# Patient Record
Sex: Male | Born: 1955 | Race: White | Hispanic: No | Marital: Married
Health system: Midwestern US, Academic
[De-identification: ages and names within clinical notes are randomized; demographics above are authoritative.]

---

## 2014-01-12 ENCOUNTER — Ambulatory Visit: Admit: 2014-01-12 | Discharge: 2014-01-12 | Payer: PRIVATE HEALTH INSURANCE

## 2014-01-12 DIAGNOSIS — M545 Low back pain, unspecified: Secondary | ICD-10-CM

## 2014-01-12 NOTE — Unmapped (Signed)
This office note has been dictated.

## 2014-01-16 NOTE — Unmapped (Signed)
[facility]     PATIENT NAME: Jacob West, Jacob West                     MRN: [id number]  DATE OF BIRTH: 05/22/1955                      CSN: [id number]  PROVIDER: Cephus Shelling, M.D.                 VISIT DATE: 01/12/2014                                NEW PATIENT OFFICE VISIT     HISTORY OF PRESENT ILLNESS:  Jacob West is a pleasant, 59 year old gentleman  in our clinic today for evaluation of severe back and right leg pain.  He  states that these symptoms have been present since August of 1973.  He has  had multiple treatments including epidural injections, physical therapy and  chiropractic care.  Symptoms are aggravated by work, standing and walking for  prolonged periods of time.  He also has some pain that starts in the right  groin and radiates down the front of the leg, but not past the knee.  He also  has sciatic symptoms down the back of the right leg.  The groin pain is very  bothersome, but has only been present for the last few months.  His back pain  has been a more chronic issue along with the episodic right leg pain.     ALLERGIES:  1. Adhesive tape allergies.     PAST MEDICAL AND SURGICAL HISTORY:  Reviewed, are pertinent for no chronic  medical conditions.  He does have a history of lithotripsy and excision of  skin cancers.     SOCIAL HISTORY:  The patient denies tobacco, alcohol or illicit drug use.     REVIEW OF SYSTEMS:  Ten-point review of systems negative for bowel or bladder  dysfunction, cardiac, pulmonary, renal or neurologic conditions.     PHYSICAL EXAMINATION:  Jacob West is an obese, 59 year old gentleman.  He is  no acute distress.  He has a good respiratory effort.  He has pain when  standing erect for more than a few seconds.  He has good range of motion of  the lumbar spine without significant restrictions.  Strength in the lower  extremities is maintained without focal deficits.  Neurologic exam  shows  symmetric patellar reflexes with no clonus on range of motion bilaterally.  Range of motion of the lower extremities elicits pain with extremes of  flexion of the right hip.  He has an overall normal gait.     IMAGING STUDIES:  MRI of the lumbar spine shows some moderate stenosis at the  lower lumbar levels, specifically at L5-S1 where there is degenerative  spondylolisthesis.  Upright x-rays of the lumbar spine show a degenerative  spondylolisthesis without acute fracture or malalignment.     ASSESSMENT AND PLAN:  Jacob West is a 59 year old gentleman who has had a  chronic issue with his back.  He has a spondylolisthesis at L5-S1.  He  suffers from a lot of back pain.  He has intermittent right leg sciatica and  a new onset groin pain.  We discussed with the patient that  surgery may not  relieve his back symptoms.  It would be more for any pressure on the nerves  and is moderate at best on the MRI.  Would recommend getting an MRI of the  right hip to evaluate for any intra-articular hip pathology.  Work on weight  loss to decrease the burden on the spine to hopefully relieve some of this  pain.  Follow-up for results of the right hip MRI.     Dr. Sandi Mealy was present and participated in all key portions of exam.     Dictated by Madalyn Rob, P.A.                                                 Cephus Shelling, M.D.  SA/nwc  D:  01/16/2014 17:48  T:  01/17/2014 16:32  R:  01/18/2014 07:25 - akg  Job #:  [id number]           NEW PATIENT OFFICE VISIT                                     PAGE    1 of   1

## 2014-01-16 NOTE — Unmapped (Signed)
Pt is scheduled for mri hip right wo . Pt is scheduled for 01/18/14 3:15. [facility] . Gave pt details.

## 2014-01-27 ENCOUNTER — Ambulatory Visit: Admit: 2014-01-27 | Discharge: 2014-01-27 | Payer: PRIVATE HEALTH INSURANCE

## 2014-01-27 DIAGNOSIS — M4317 Spondylolisthesis, lumbosacral region: Secondary | ICD-10-CM

## 2014-01-27 NOTE — Unmapped (Signed)
This office note has been dictated.

## 2014-01-27 NOTE — Unmapped (Signed)
[facility] AND [facility] MEDIC[facility]     PATIENT NAME:  Jacob West, Jacob West                      MRN:  [id number]  DATE OF BIRTH:  03/07/1955                       CSN:  [id number]  PROVIDER:  Cephus Shelling, M.D.                  VISIT DATE:  01/27/2014                                       OFFICE NOTE     Mr. Bierlein is here today for results of his MRI of his hip.  He has stenosis  in his lower back given his spondylolisthesis at L5-S1.  The findings are  moderate, but he has severe pain.  He has had epidural injections in the  past, which do give him benefit.  He also has some groin pain that started  more recently that is fairly severe.  It does not seem to increase with  walking.  The patient also complains of severe back and right radicular leg  pain.     MRI of the right hip was reviewed.  It shows no severe arthritis of the hip.  He does have pistol grip deformities bilaterally but no ____ paralabral cyst  formation or high-grade chondromalacia.     ASSESSMENT AND PLAN:  Mr. Landess had an MRI of his hip to rule out severe  arthritis given his groin pain.  The MRI is inconclusive for any severe  arthritis.  He does have a previous MRI of his lower back that shows moderate  stenosis.  He has a lot of back pain, and he also has right leg pain. It was  discussed with him and his wife today that surgery would most likely not  benefit his back pain.  It was recommended that he work on weight loss and  continue physical therapy exercises before deciding to proceed with any  surgical intervention in his back.  He is seeking a second opinion and will  follow up as needed.     I discussed the history and physical examination findings with Dr. Sandi Mealy,  who is in agreement with the treatment plan.     Dictated by Hillery Hunter, P.A.                                              Cephus Shelling, M.D.  SA/jlq  D:  01/27/2014 17:21  T:  01/31/2014 11:34  R:   01/31/2014 13:32 - kp  Job #:  [id number]           OFFICE NOTE                                                  PAGE    1 of   1

## 2017-11-04 IMAGING — CT CT ABDOMEN PELVIS W/ UROGRAM
2 of 5 series · 14 of 42 positions shown, 16 images · IV contrast (APPLIED)
Comparison: Comparison was made to the prior exam(s) within the last 12 months 
dated  ultrasound kidneys 10/21/2017.

CT ABDOMEN PELVIS W/ UROGRAM, 11/04/2017 [DATE]: 
CLINICAL INDICATION:  Elevated white blood cells history of leukemia and 
hydronephrosis. 
A search for DICOM formatted images was conducted for prior CT imaging studies 
completed at a non-affiliated media free facility.
TECHNIQUE: The region of interest was scanned without and with 100 IV contrast 
on a high resolution CT scanner using dose reduction techniques.  Routine MPR 
reconstructions were performed.

[Series 5: axial portal · axial · portal-venous · 0.98mm/px · z∈[-443,+46]mm · 11 of 197 slices shown, 13 images]
[im 17/197  soft-tissue]
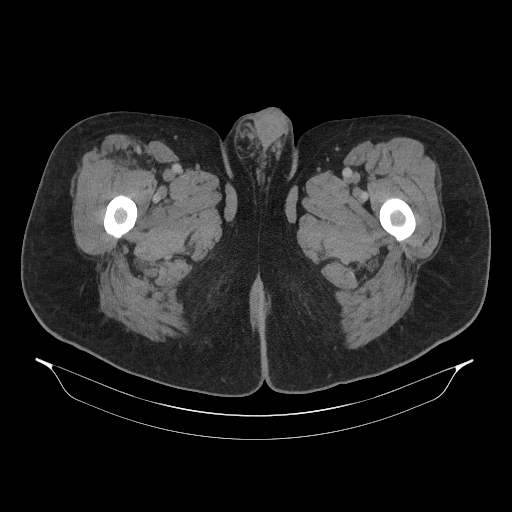
[im 17/197  bone]
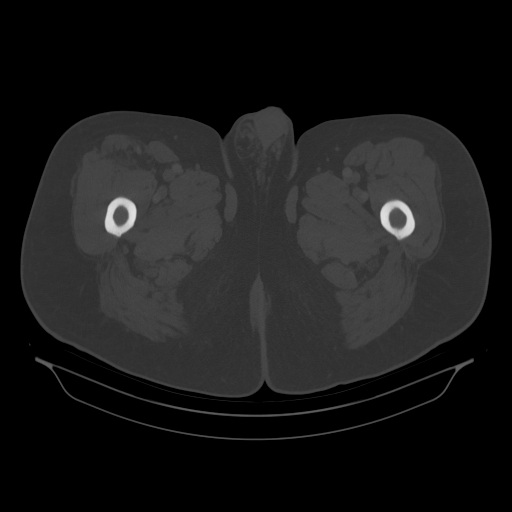
[im 33/197  soft-tissue]
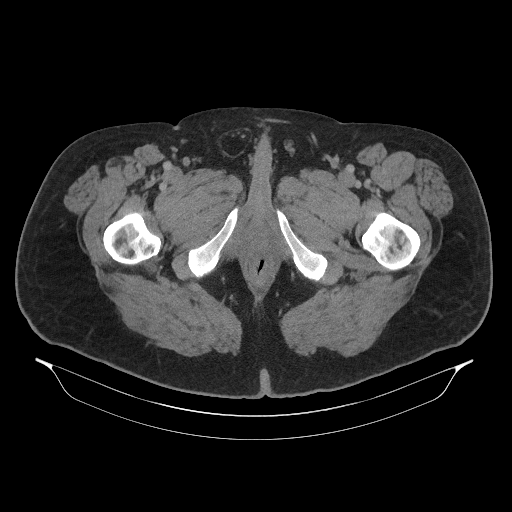
[im 50/197  soft-tissue]
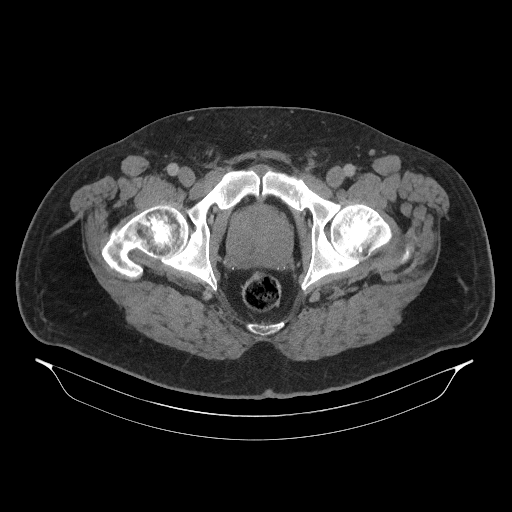
[im 66/197  soft-tissue]
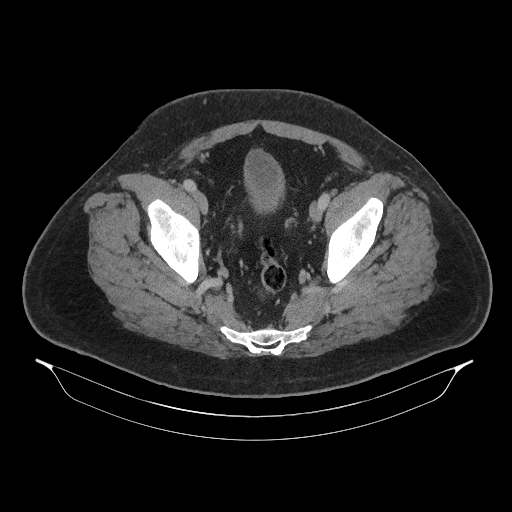
[im 82/197  soft-tissue]
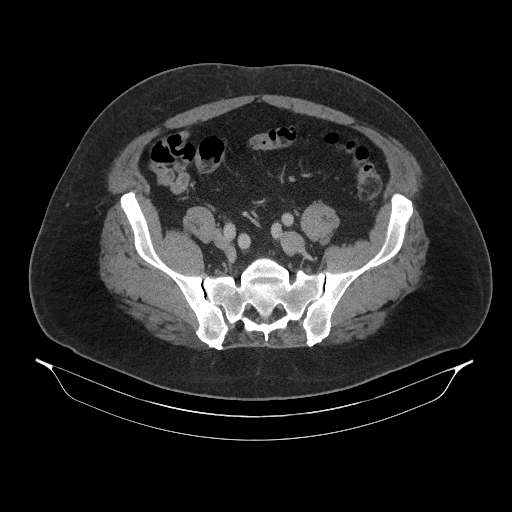
[im 99/197  soft-tissue]
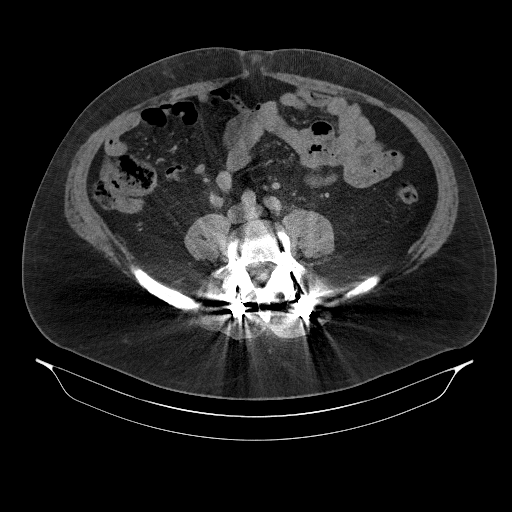
[im 115/197  soft-tissue]
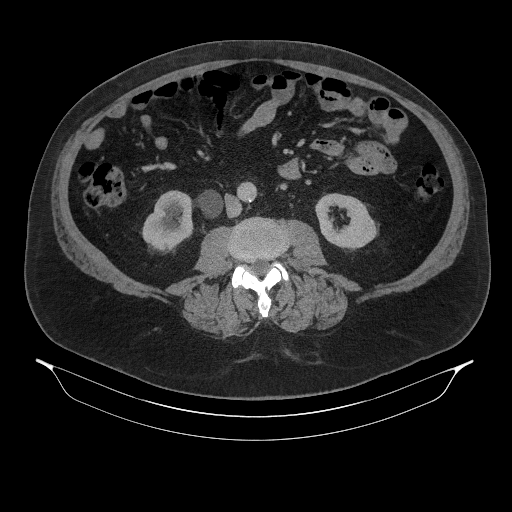
[im 131/197  soft-tissue]
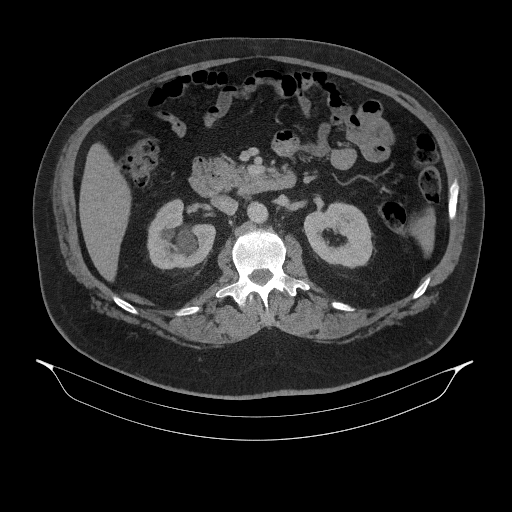
[im 148/197  soft-tissue]
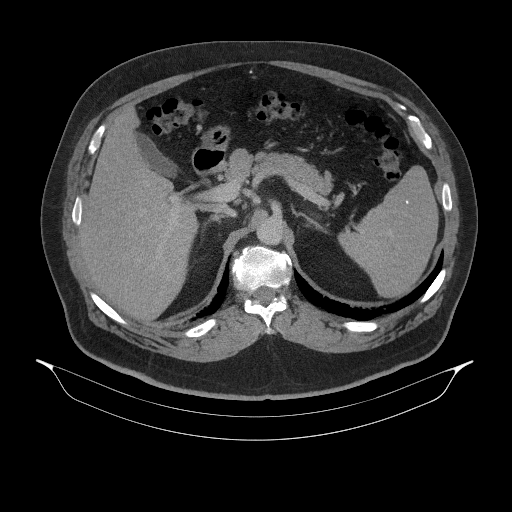
[im 148/197  bone]
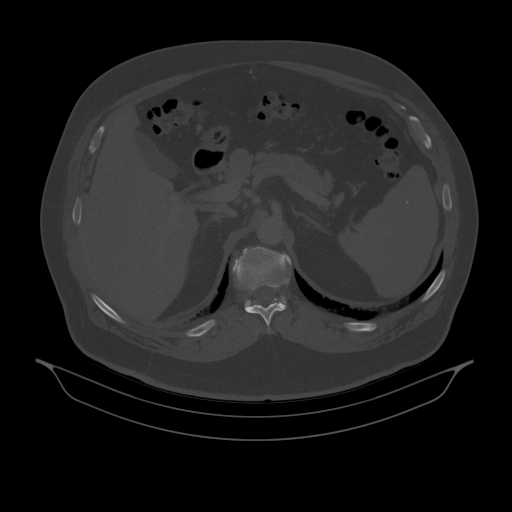
[im 164/197  soft-tissue]
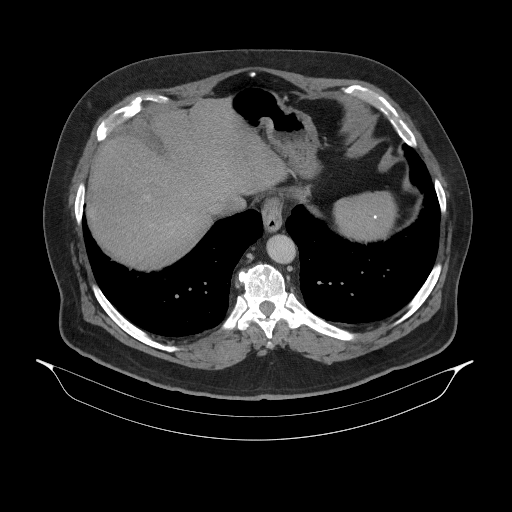
[im 180/197  soft-tissue]
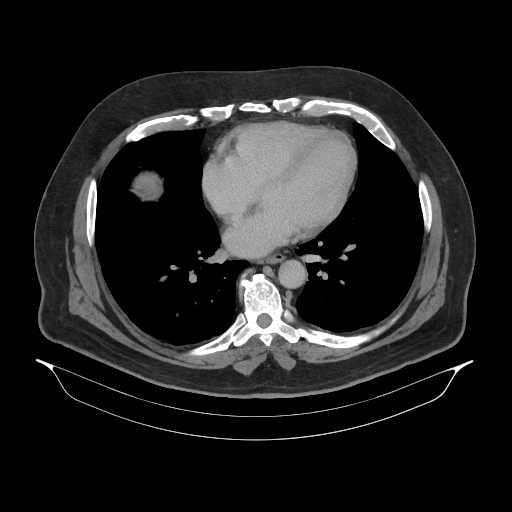

[Series 6: cor portal · coronal · portal-venous · 0.98mm/px · 3 of 156 slices shown]
[im 39/156  soft-tissue]
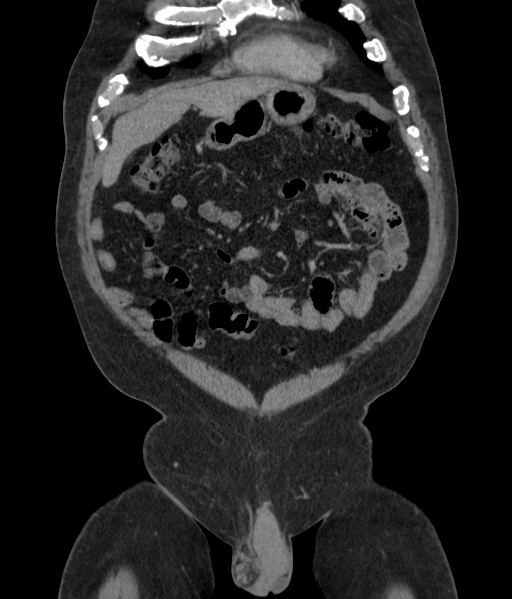
[im 78/156  soft-tissue]
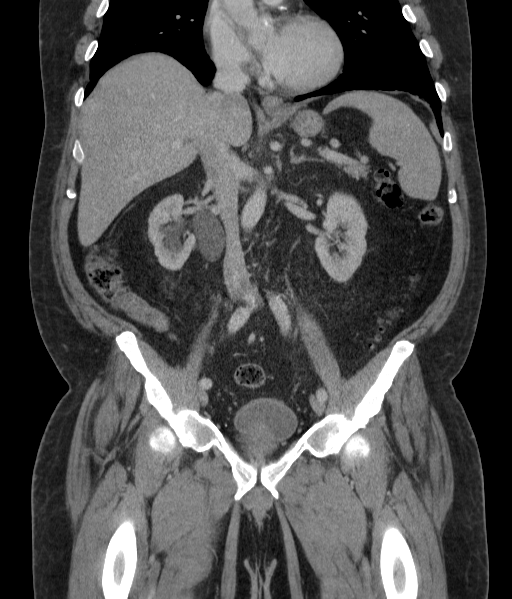
[im 117/156  soft-tissue]
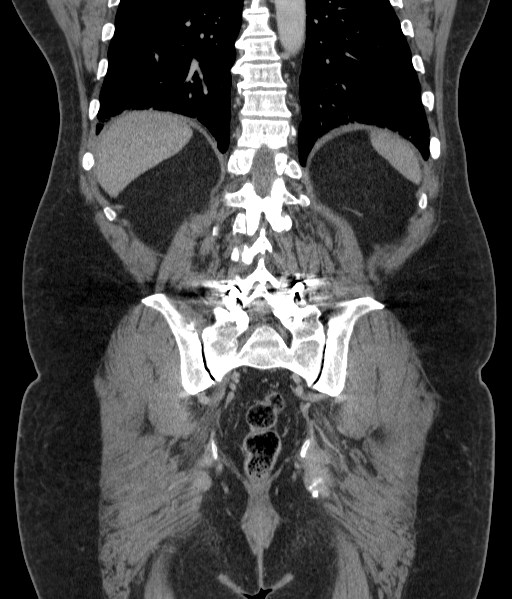

[14 of 42 positions shown; findings below may reference images not displayed]

FINDINGS: KIDNEYS: Lobulated cortex right kidney moderate severe right hydronephrosis 
secondary to 13 mm calculus within the proximal left ureter at the level of the 
aortic bifurcation/L4-5 disc space. Left kidney is without nephrolithiasis no 
solid renal mass. Delayed hilar gram on the right limited opacification of the 
right ureter to the level of the obstruction. 
URETERS: Limited opacification of the right ureter secondary to the obstruction 
from the calculus noted above. The left collecting system and ureter are normal 
caliber with no abnormal contrast enhancement. 
BLADDER: Mild bladder wall thickening the prominent prostate measuring 6.5 cm. 
LUNG BASES: Clear. 
LIVER : Fatty infiltration. No mass.  No intrahepatic biliary dilatation. 
GALLBLADDER: No wall thickening.  No stone. 
COMMON BILE DUCT: Normal caliber.  No stones. 
SPLEEN: Calcified granulomas. 
PANCREAS: No mass.  No pancreatic fluid collections. 
ADRENALS: No masses. 
LYMPH NODES: No adenopathy. 
STOMACH, SMALL BOWEL AND COLON: No bowel wall thickening or obstruction. 
Duodenal diverticulum. 
PERITONEAL CAVITY: No mesenteric stranding or free fluid. Mild misty mesentery. 
Right inguinal hernia containing fat. Small umbilical hernia containing fat. 
OSSEOUS STRUCTURES: Postsurgical change lumbar spine pedicular screws L5 and S1 
with associated posterior fixation rods and interbody fusion of L5-S1 less than 
grade 1 anterolisthesis of L5. Mild loss of height of the superior endplate of 
L2. No acute fracture or destructive lesion. 
ABDOMINAL AORTA: No aneurysm. 
PELVIC ORGANS: Mild bladder wall thickening. Prominent prostate measuring
cm.
IMPRESSION: Moderate severe right hydronephrosis and hydroureter down to level of the aortic 
bifurcation/L4-5 disc space. 
Renal cysts but no solid renal mass. 
No left nephrolithiasis or hydronephrosis. 
Prominent prostate with mild bladder wall thickening. 
No abnormal contrast enhancement collecting system or bladder. 
Fatty liver. 
Old granulomatous disease. 
Inguinal and umbilical hernias containing fat. 
RADIATION DOSE REDUCTION: All CT scans are performed using radiation dose 
reduction techniques, when applicable.  Technical factors are evaluated and 
adjusted to ensure appropriate moderation of exposure.  Automated dose 
management technology is applied to adjust the radiation doses to minimize 
exposure while achieving diagnostic quality images.

## 2017-11-12 IMAGING — DX ABDOMEN 1 VIEW
1 series · 2 of 2 positions shown · non-contrast
Comparison: 11/04/2017 CT

ABDOMEN 1 VIEW, 11/12/2017 [DATE]: 
CLINICAL INDICATION: Right-sided kidney stone.

[Series 1: AP · U · 0.14mm/px · 2 of 2 slices shown]
[im 1/2]
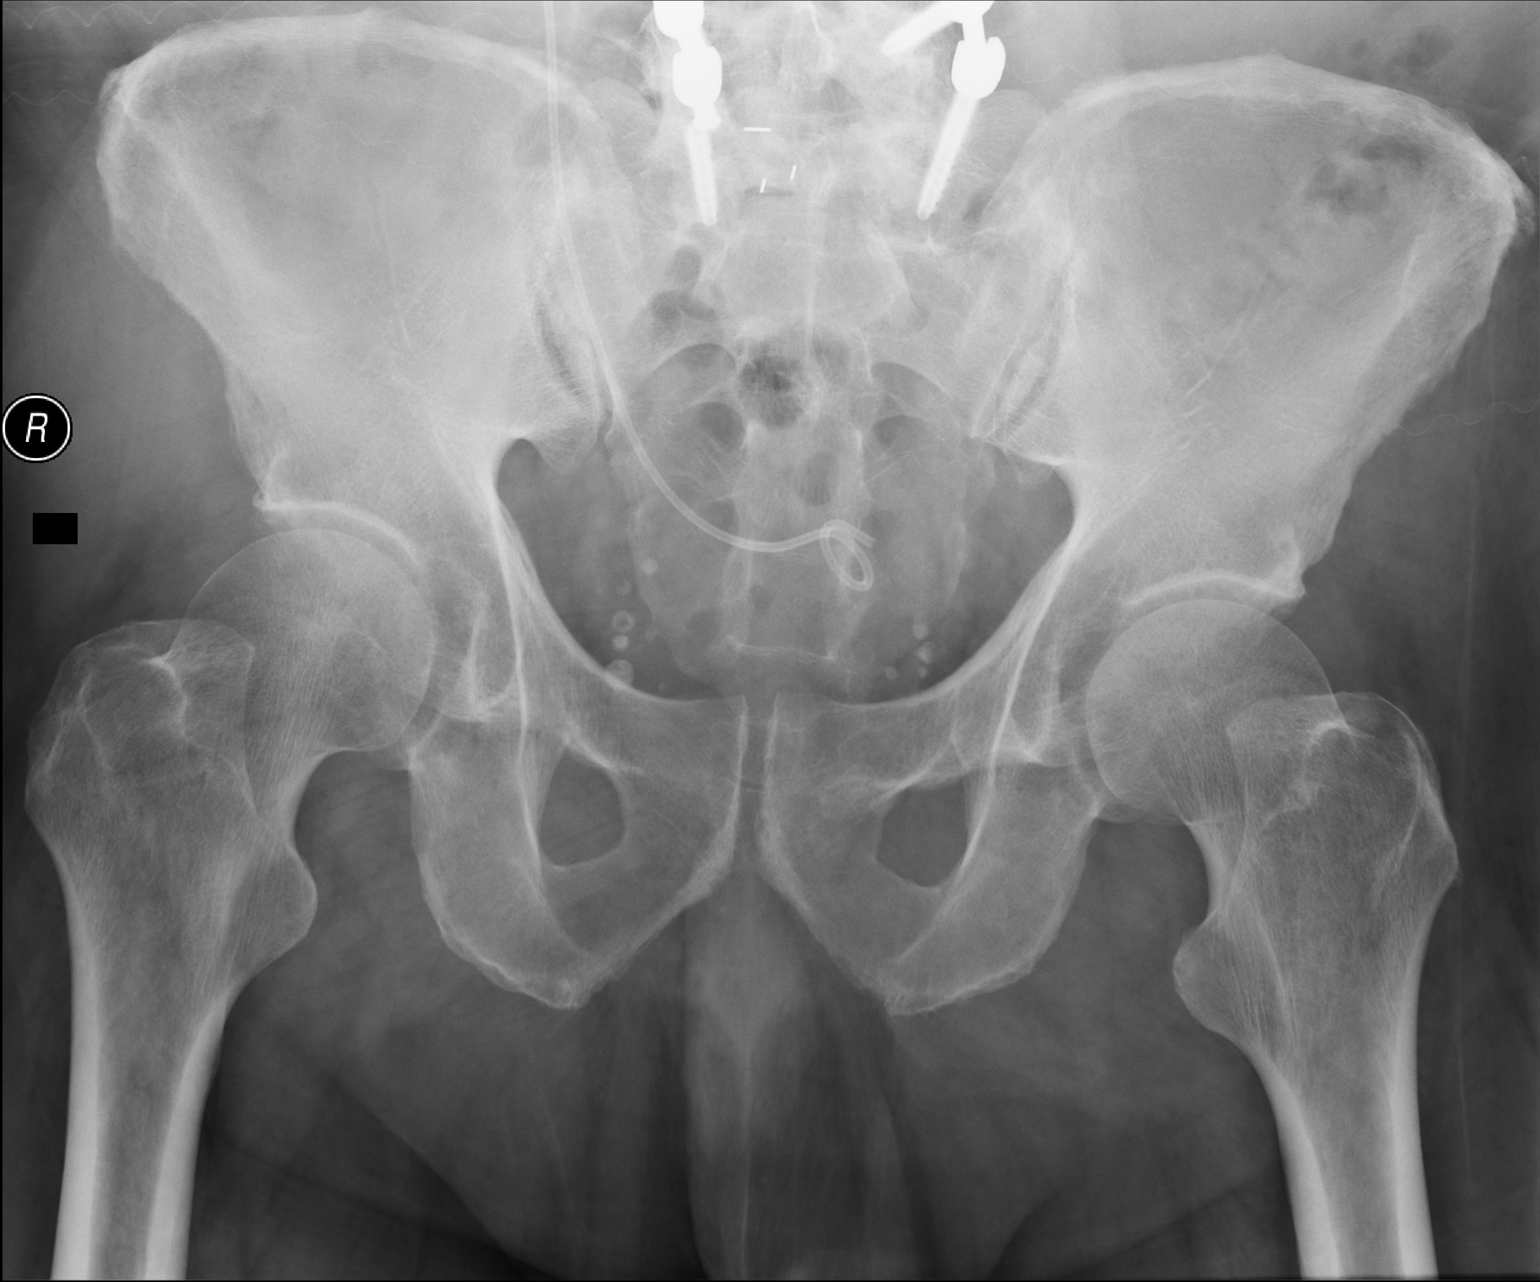
[im 2/2]
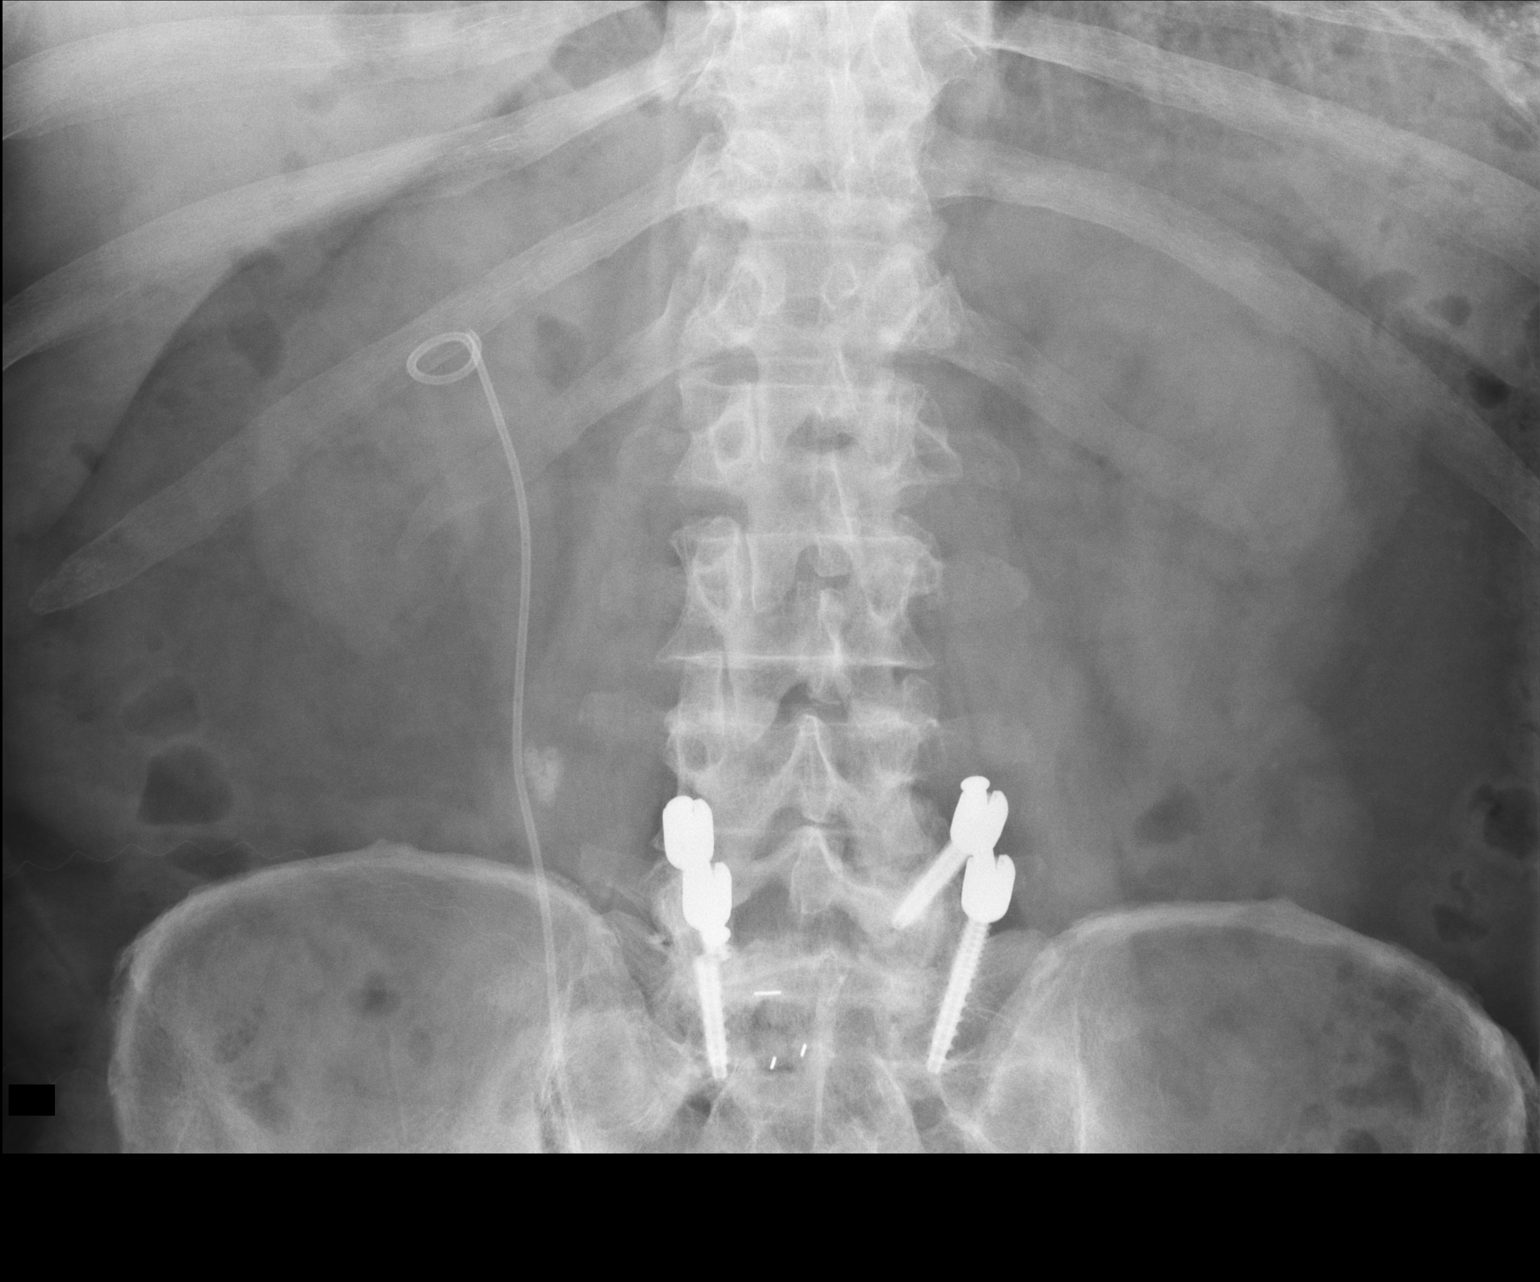

[2 of 2 positions shown; findings below may reference images not displayed]

FINDINGS: New right ureteral stent. Fragmented right mid ureteral calculi, measuring up to 
8 mm individually and 1.7 cm in conglomeration. No renal calculi identified. 
Pedicle screw fixation of the L4 vertebra and the transitional segment, with 
fracture of the right inferior screw. Intervertebral disc spacer. Pelvic 
phleboliths. Enthesopathy. Stable mild L1 vertebral body compression fracture. 
Degenerative change of the SI joints.
IMPRESSION: New right ureteral stent. Stable positioning of the 3 adjacent calculi in the 
right mid ureter, measuring up to 8 mm.

## 2017-11-30 IMAGING — DX ABDOMEN 1 VIEW
1 series · 1 of 1 positions shown · non-contrast
Comparison: KUB of 11/12/2017.

ABDOMEN 1 VIEW, 11/30/2017 [DATE]: 
CLINICAL INDICATION: Calculus of ureter. Lithotripsy. Still some pain. History 
of leukemia.

[AP]
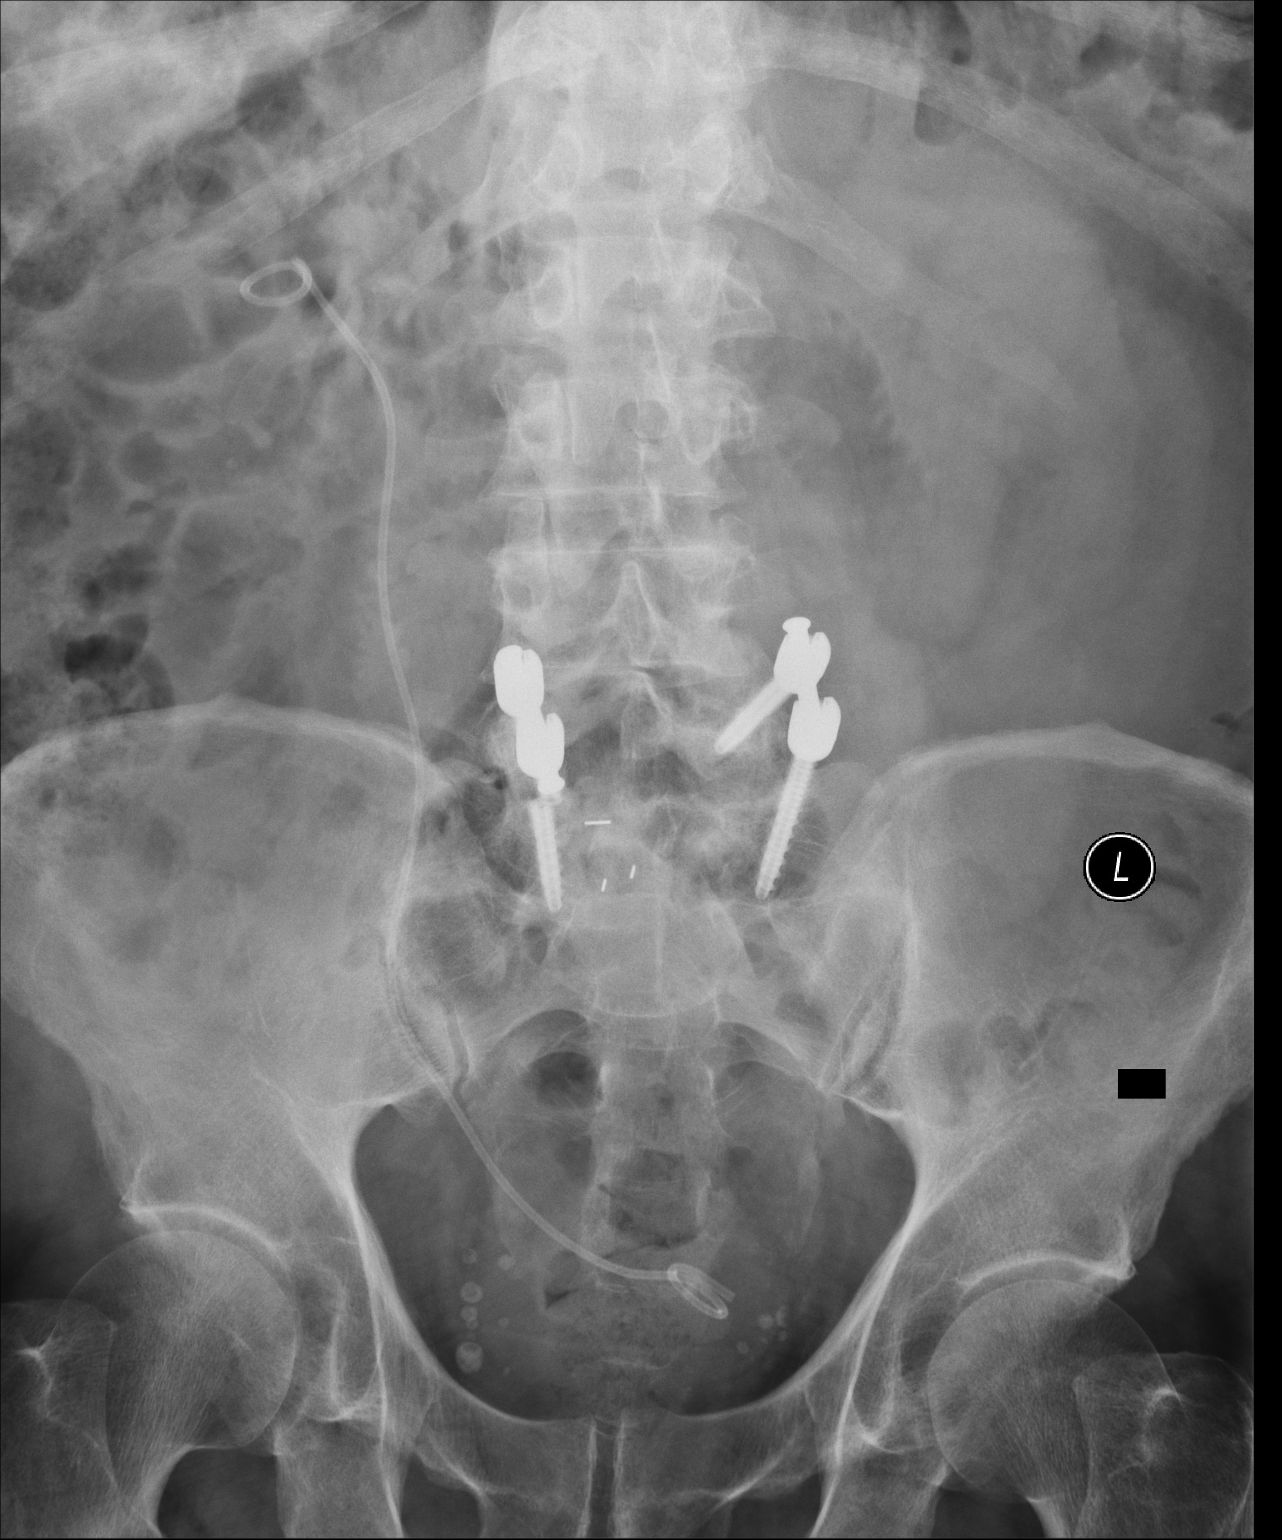

[1 of 1 positions shown; findings below may reference images not displayed]

FINDINGS: Overlying gas and stool obscure some detail. Interval decrease and size and 
prominence of the calculi at the right mid ureter level. Some calculi are 
suggested to remain at this level measuring in conglomeration up to 11 mm in 
length. Indwelling double-J ureteral stent is again seen with tips unchanged in 
position. Transpedicular screw and rod fixation L4-transitional segment with 
fractured right pedicle screw, unchanged. Pelvic phleboliths.
IMPRESSION: Indwelling right ureteral stent with persistent calculi suggested the mid ureter 
level, decreased in size/prominence.

## 2017-12-16 IMAGING — DX ABDOMEN 1 VIEW
1 series · 2 of 2 positions shown · non-contrast
Comparison: KUB of 11/30/2017.

ABDOMEN 1 VIEW, 12/16/2017 [DATE]: 
CLINICAL INDICATION: Checking for kidney stones on right. Lithotripsy. Leukemia.

[Series 1: AP · U · 0.14mm/px · 2 of 2 slices shown]
[im 1/2]
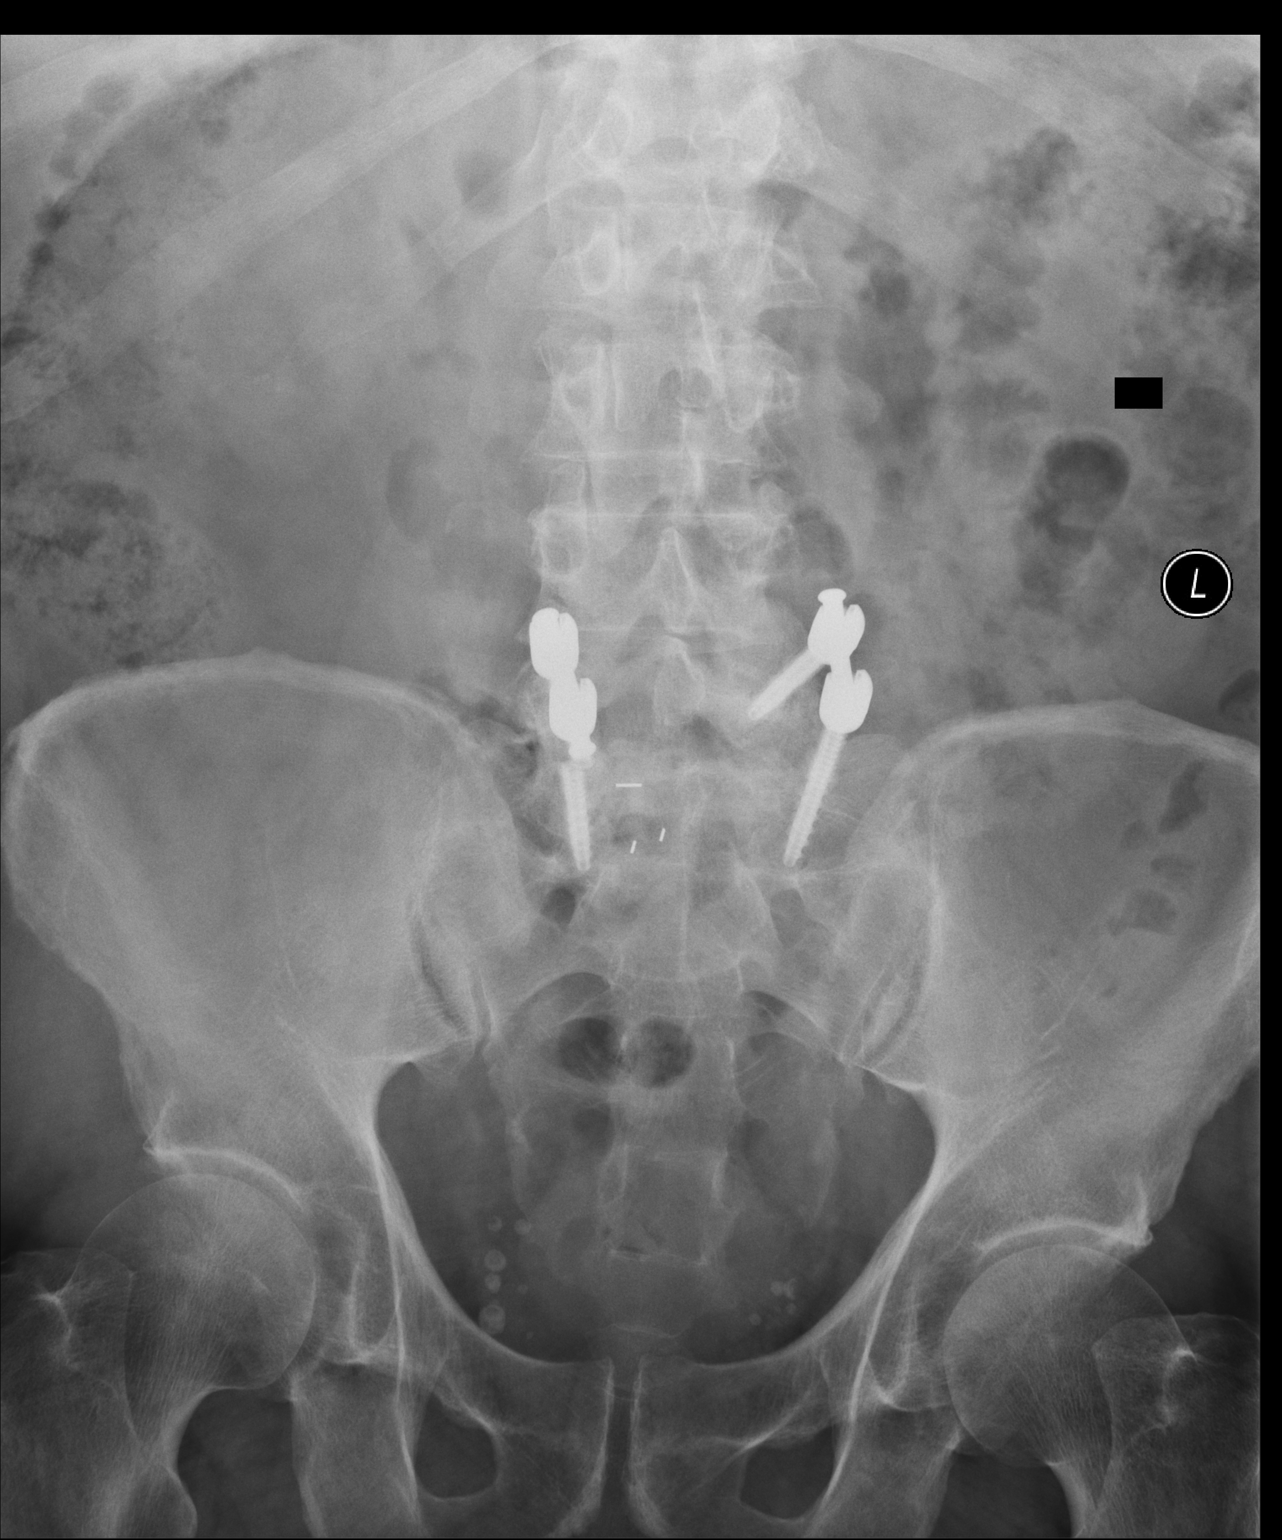
[im 2/2]
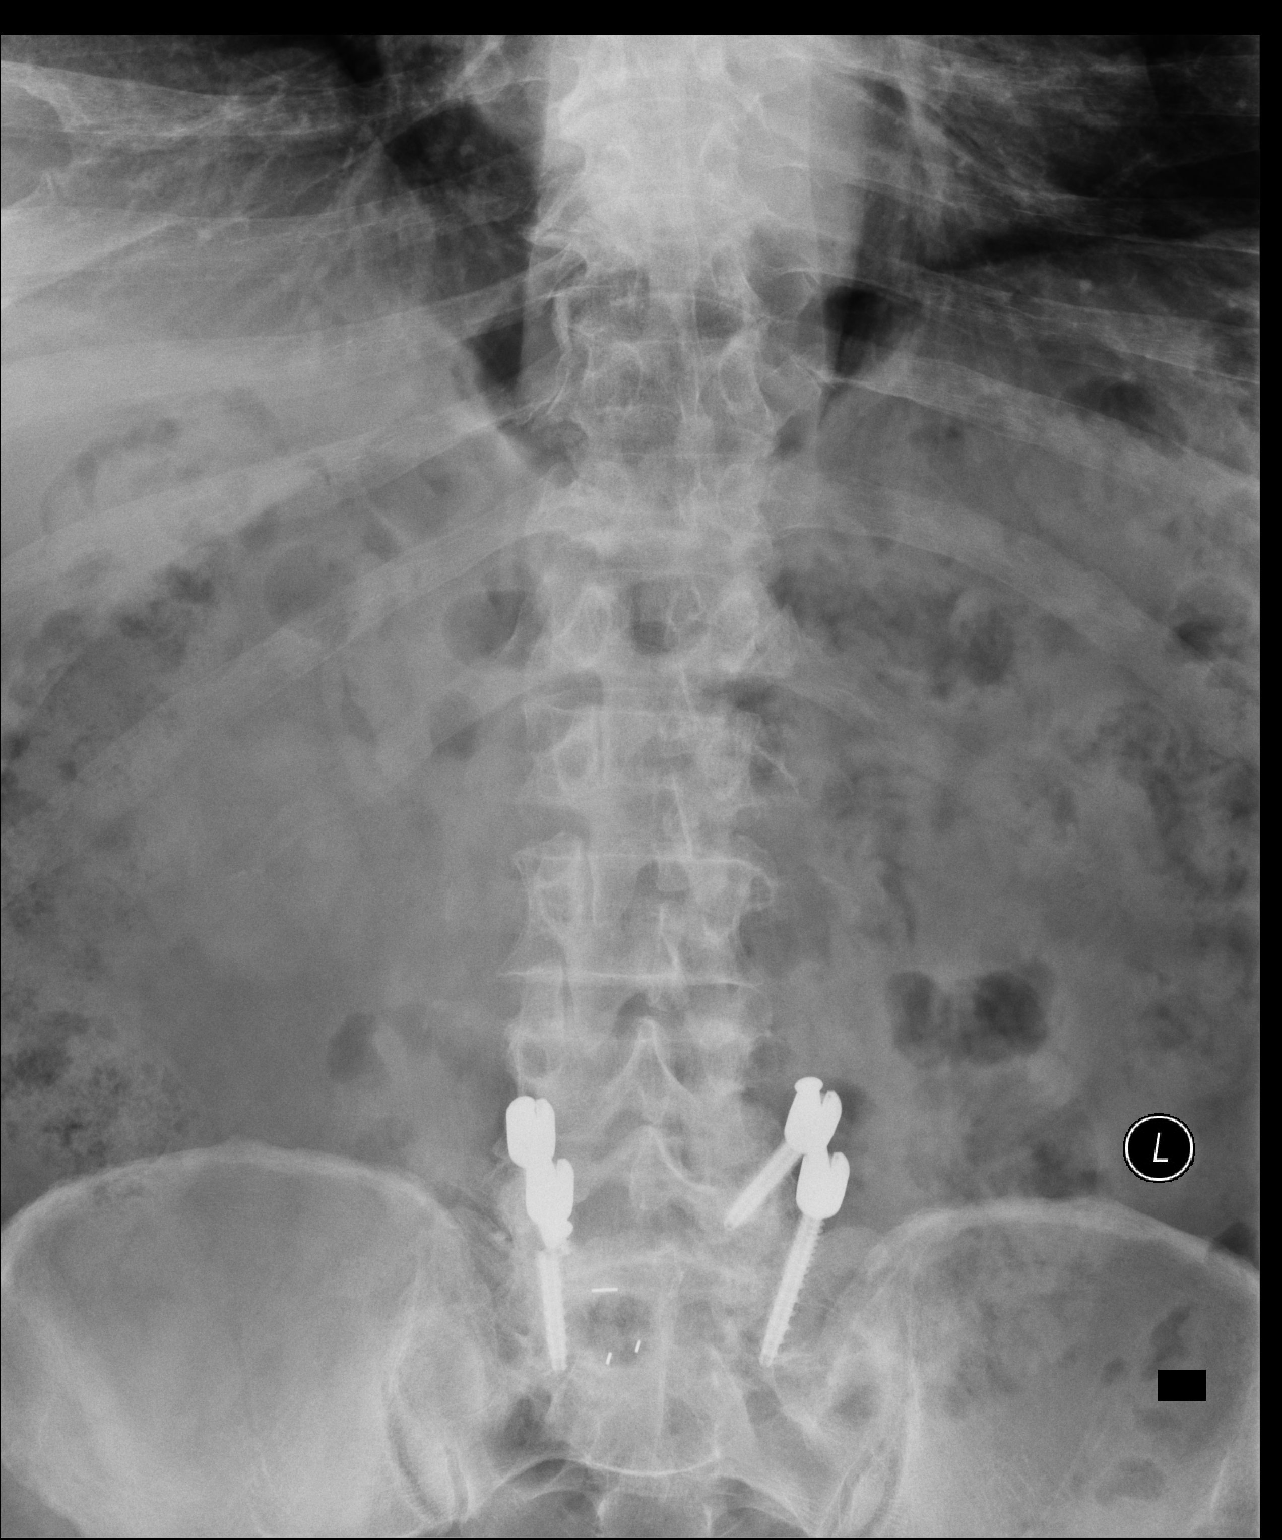

[2 of 2 positions shown; findings below may reference images not displayed]

FINDINGS: Interval removal of the previously seen right-sided PICC line right ureteral 
stent. No discrete remaining ureteral calculi. No renal or bladder calculi. 
Pelvic phleboliths. Transpedicular screw and rod fixation L4-transitional 
segment with fractured right pedicle screw, unchanged.
IMPRESSION: No urinary calculus identified on today's exam.

## 2018-01-11 IMAGING — CT CT ABDOMEN AND PELVIS WITHOUT CONTRAST
2 of 5 series · 15 of 46 positions shown, 18 images · non-contrast
Comparison: Comparison was made to the prior exam(s) within the last 12 months 
dated  11/04/2017

CT ABDOMEN AND PELVIS WITHOUT CONTRAST, 01/11/2018 [DATE]: 
CLINICAL INDICATION:  Ureteral calculus. Follow-up after lithotripsy. 
A search for DICOM formatted images was conducted for prior CT imaging studies 
completed at a non-affiliated media free facility.
TECHNIQUE: The abdomen and pelvis were scanned from lung bases through the 
pubic rami without contrast on a high-resolution CT scanner using dose reduction 
techniques..  Routine MPR reconstructions were performed.

[Series 2: abd/pel ax wo · axial · 0.98mm/px · z∈[-480,-15]mm · 12 of 173 slices shown, 15 images]
[im 12/173  soft-tissue]
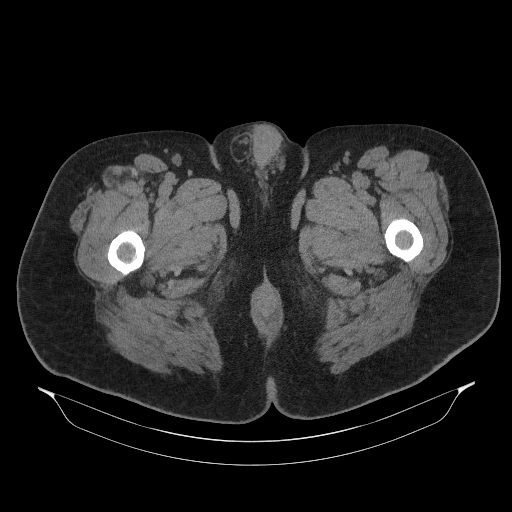
[im 12/173  bone]
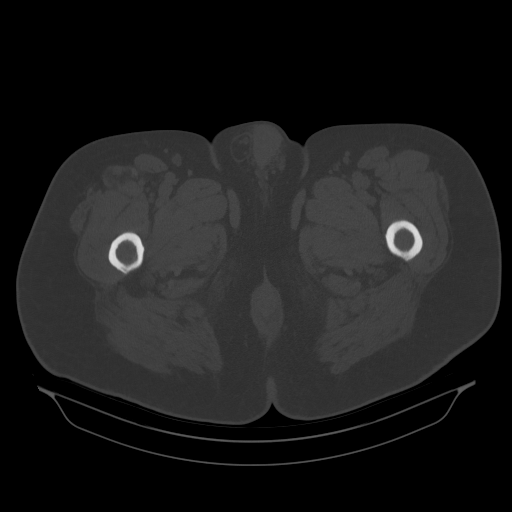
[im 34/173  soft-tissue]
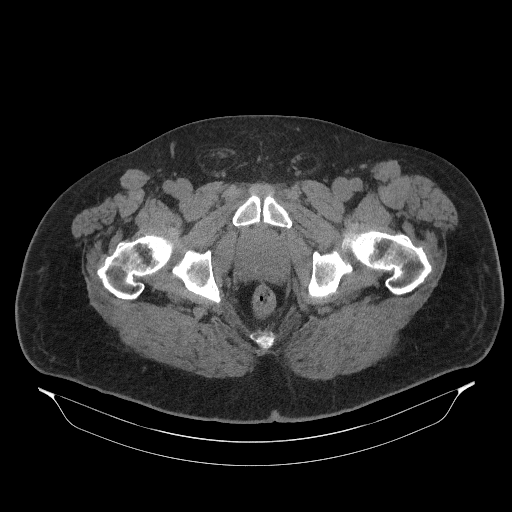
[im 50/173  soft-tissue]
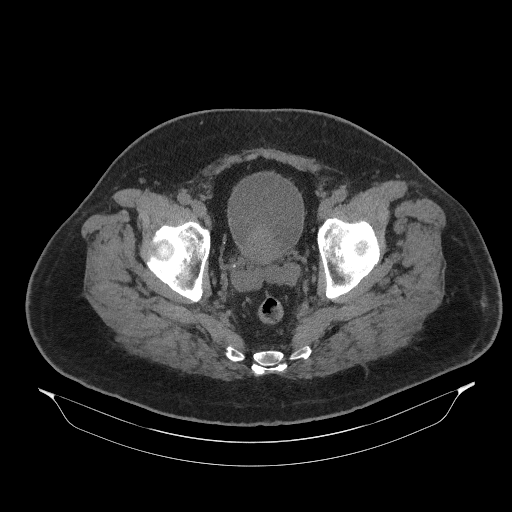
[im 67/173  soft-tissue]
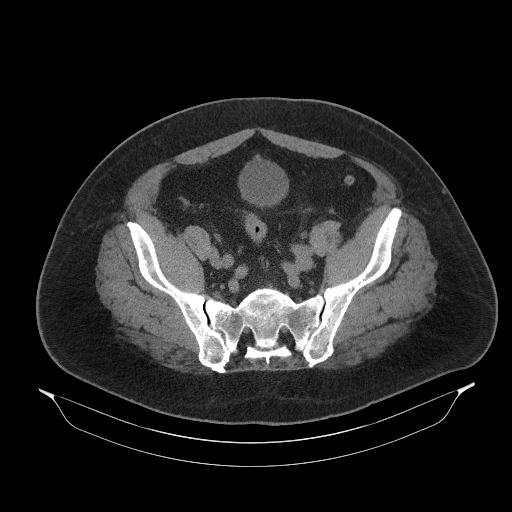
[im 89/173  soft-tissue]
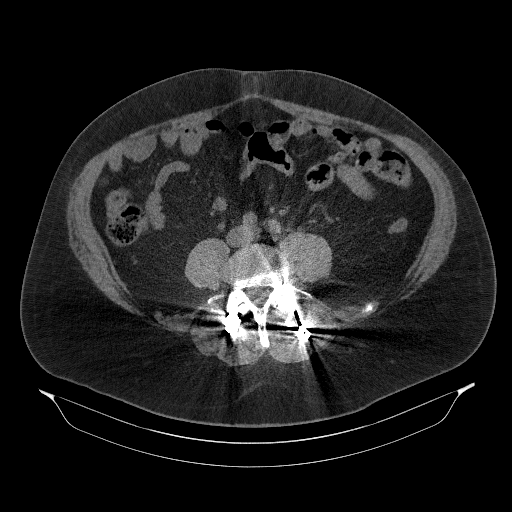
[im 106/173  soft-tissue]
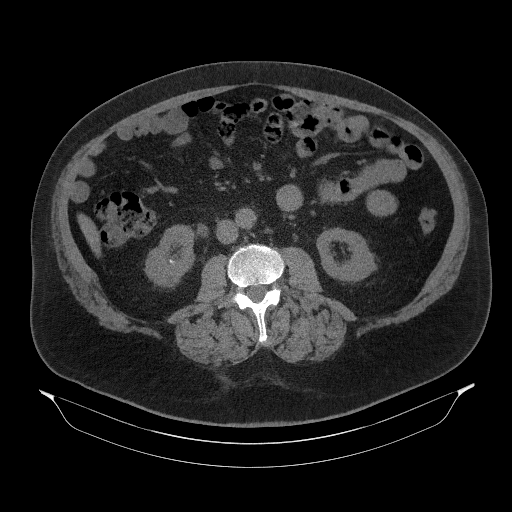
[im 123/173  soft-tissue]
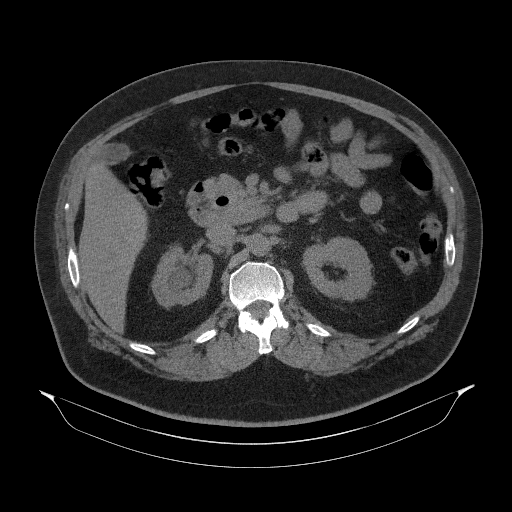
[im 145/173  soft-tissue]
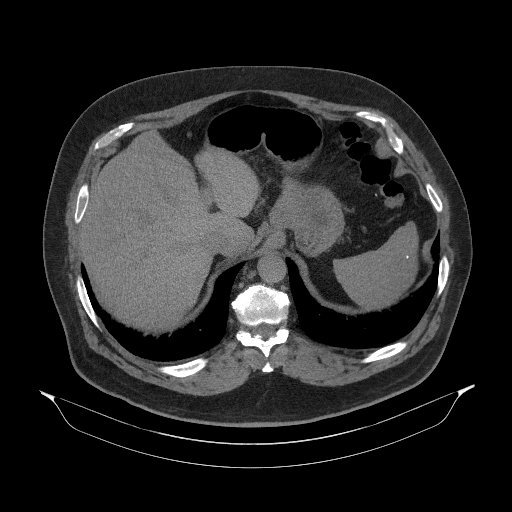
[im 150/173  lung]
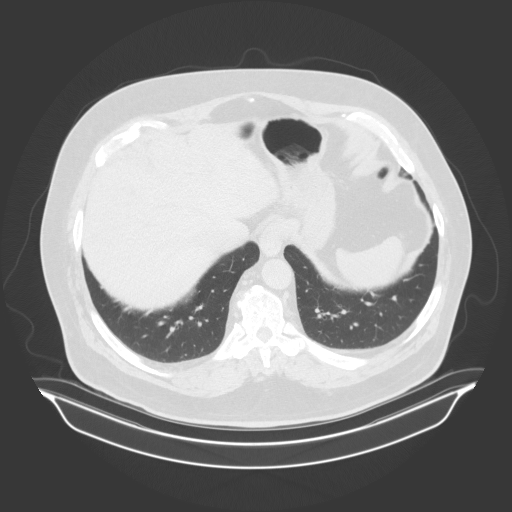
[im 156/173  lung]
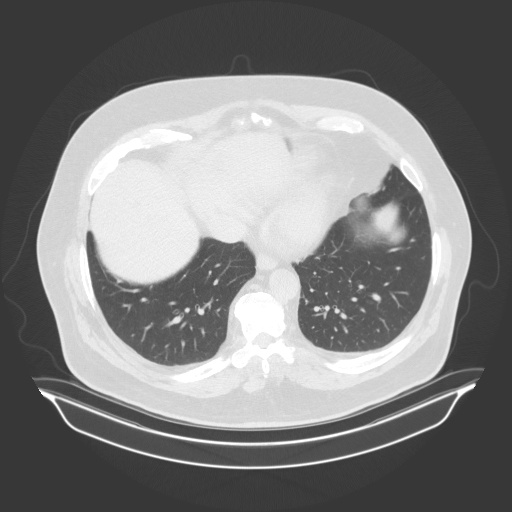
[im 161/173  soft-tissue]
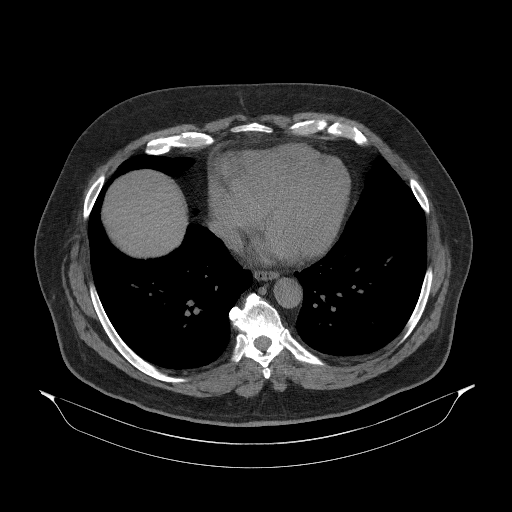
[im 161/173  lung]
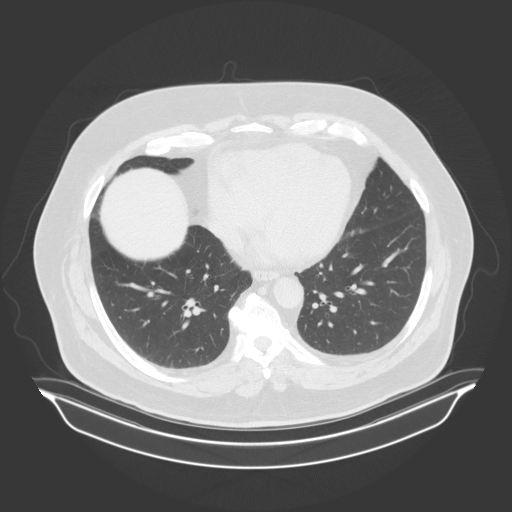
[im 161/173  bone]
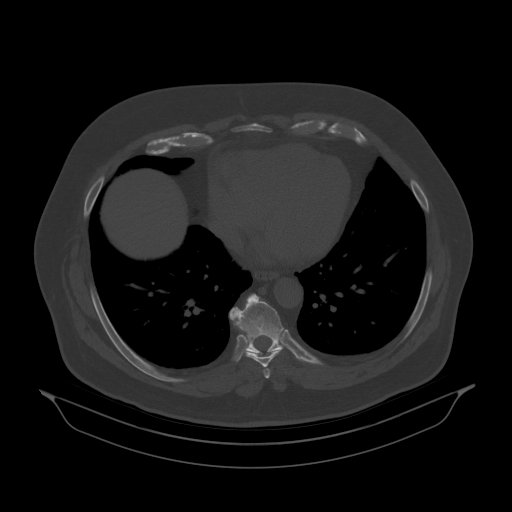
[im 167/173  lung]
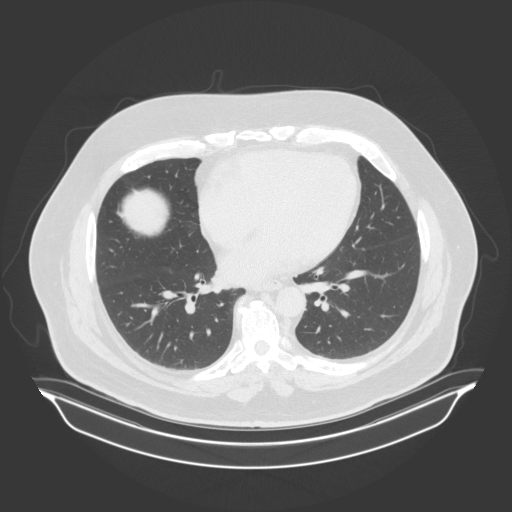

[Series 3: abd/pel cor wo · coronal · 0.97mm/px · 3 of 151 slices shown]
[im 38/151  soft-tissue]
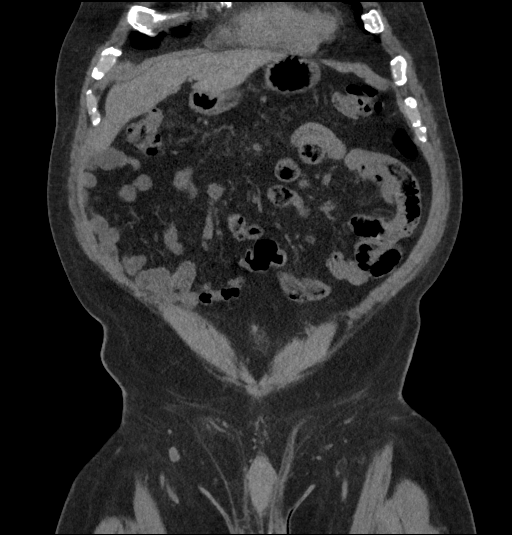
[im 76/151  soft-tissue]
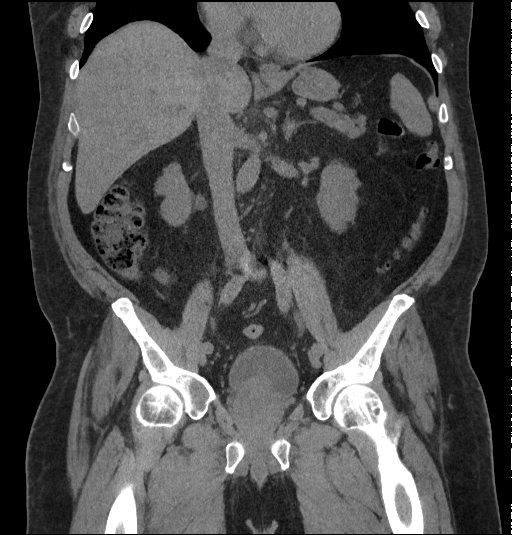
[im 113/151  soft-tissue]
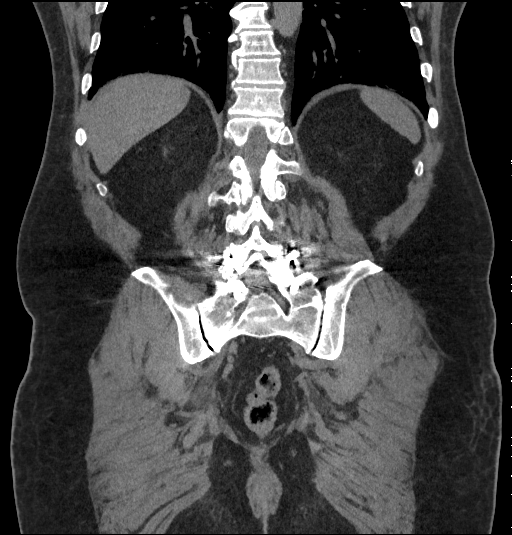

[15 of 46 positions shown; findings below may reference images not displayed]

FINDINGS: The lung bases are clear. The liver, gallbladder, spleen, pancreas, 
adrenals and left kidney are normal. The right kidney again demonstrates 
hydronephrosis which has improved and there is also dilatation of the right 
ureter which is also improved. There is obstructing calculi in the distal right 
ureter measuring up to 5 mm x 4 mm x 9 mm and has Hounsfield units of 717. These 
calculi are more fragmented and smaller and more distal now within the right 
ureter. There is a 3 mm nonobstructing calculus in the lower pole of the right 
kidney. The bowel loops within the abdomen appear normal without inflammatory 
changes or obstruction. Stomach is decompressed. 
On CT of the pelvis there is an enlarged prostate which indents the base of the 
urinary bladder and measures up to 6.9 cm x 6.5 cm. No free fluid in pelvis. No 
inguinal adenopathy no pelvic mass. No lytic or blastic lesion seen in the 
lumbar spine or pelvis. There are pedicle screws with posterior fusion at L5 and 
S1 as well as fusion of the L5-S1 disc space.
IMPRESSION: Renal calculi fragments now seen in the distal right ureter which have 
decreased in number and size when compared to previous study now collectively 
measuring up to 5 mm x 4 mm x 9 mm and has Hounsfield units of +717. There is 
some mild residual right hydronephrosis as well as right hydroureter which is 
also improved. 
Nonobstructing right renal calculi also present. 
Enlarged prostate which indents the base of the urinary bladder. 
RADIATION DOSE REDUCTION: All CT scans are performed using radiation dose 
reduction techniques, when applicable.  Technical factors are evaluated and 
adjusted to ensure appropriate moderation of exposure.  Automated dose 
management technology is applied to adjust the radiation doses to minimize 
exposure while achieving diagnostic quality images.

## 2019-10-27 IMAGING — CT CT GUIDED SI INJECTION
1 of 2 series · 15 of 32 positions shown, 19 images · non-contrast
Comparison: none

CT GUIDED SI INJECTION, 10/27/2019 [DATE]: 
CLINICAL INDICATION:  [Pain. Inability to lay flat.
TECHNIQUE: The patient was placed prone on the CT table. The SI joints were 
imaged using dose reduction techniques. The overlying skin was sterilely prepped 
and draped. 2% lidocaine was used for local anesthesia. A 22-gauge spinal needle 
was advanced into the posterior inferior aspect of left SI joint using CT 
guidance. Once intra-articular placement of the needle tip was obtained, 10 cc 
of dexamethasone and 2 cc of 0.5% bupivicaine were injected. There were no 
immediate complications. 
Patient rated pain [DATE] preprocedure and [DATE] post procedure.

[Series 2: pre lt inj 1.0 i31s 2 · axial · non-contrast · 0.54mm/px · z∈[-275,-187]mm · 15 of 96 slices shown, 19 images]
[im 4/96  soft-tissue]
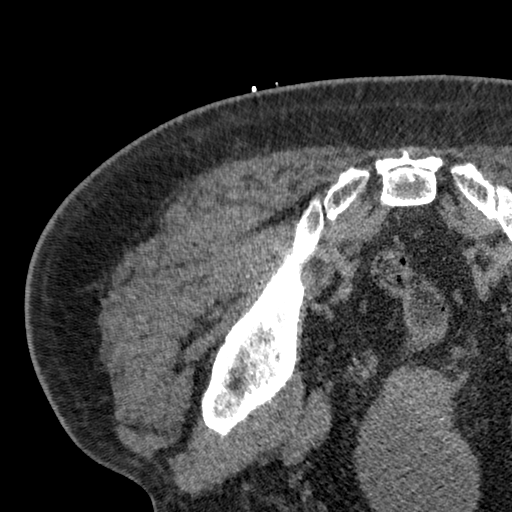
[im 4/96  bone]
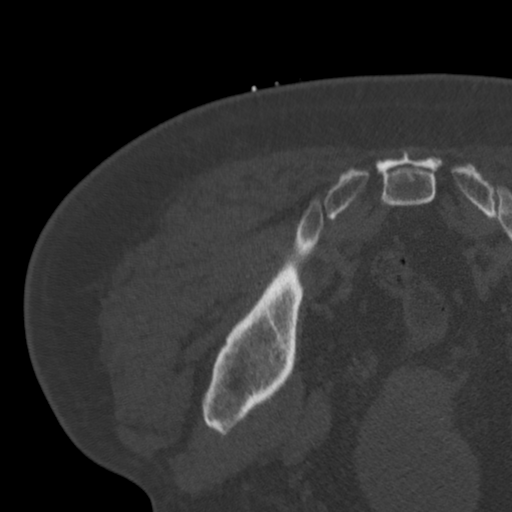
[im 11/96  soft-tissue]
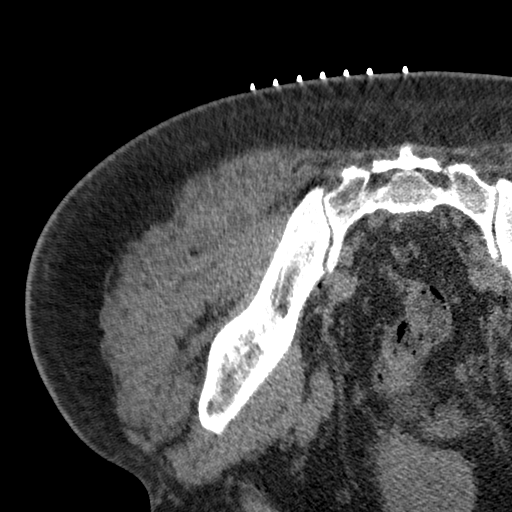
[im 19/96  soft-tissue]
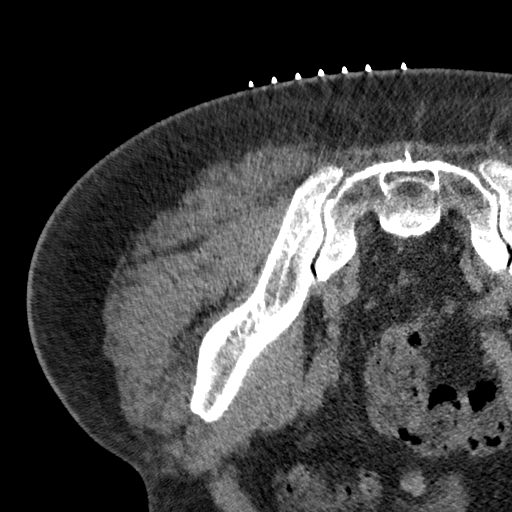
[im 26/96  soft-tissue]
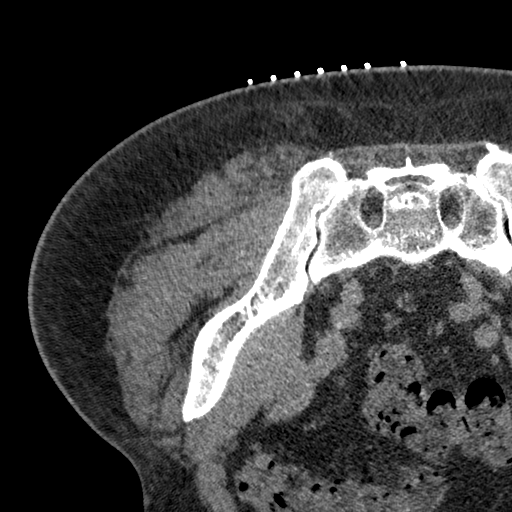
[im 33/96  soft-tissue]
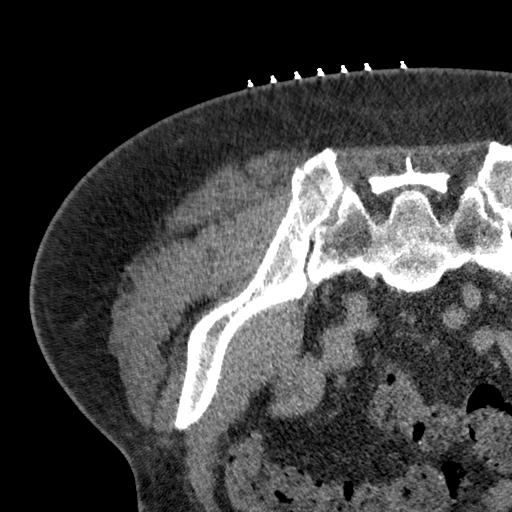
[im 41/96  soft-tissue]
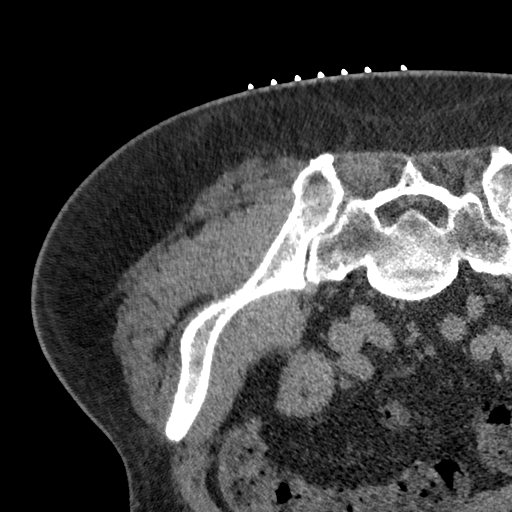
[im 48/96  soft-tissue]
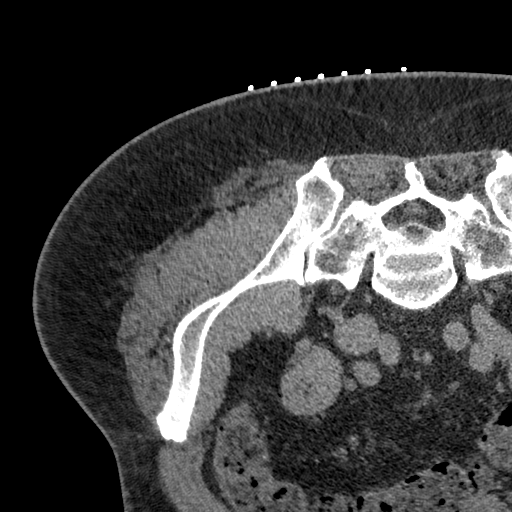
[im 55/96  soft-tissue]
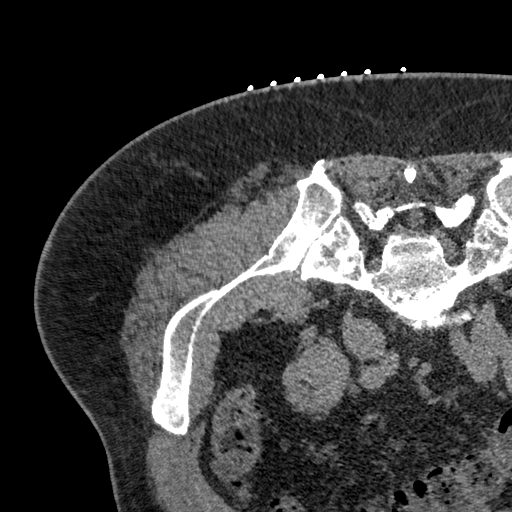
[im 63/96  soft-tissue]
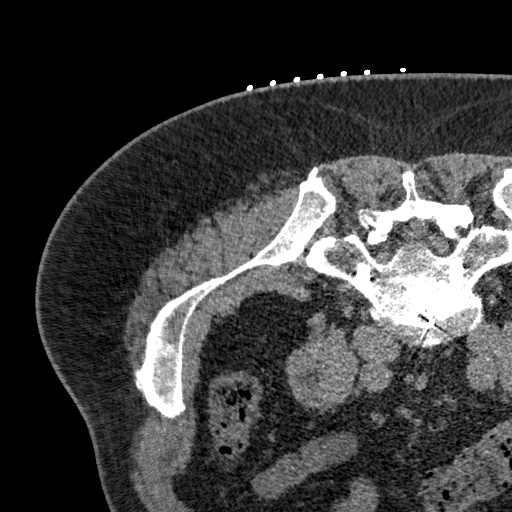
[im 63/96  bone]
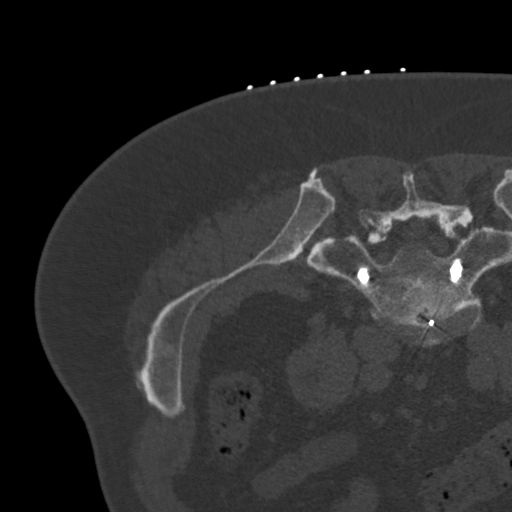
[im 70/96  soft-tissue]
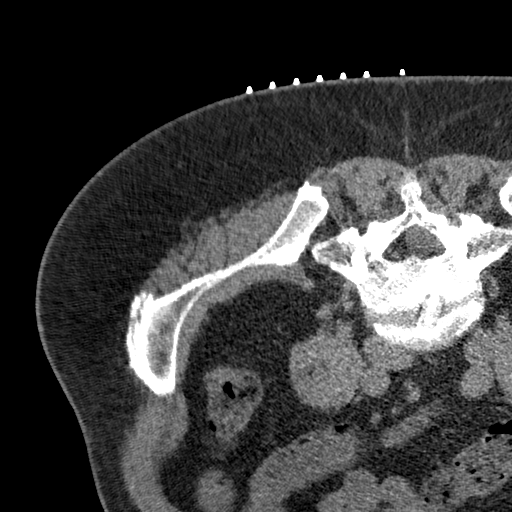
[im 77/96  soft-tissue]
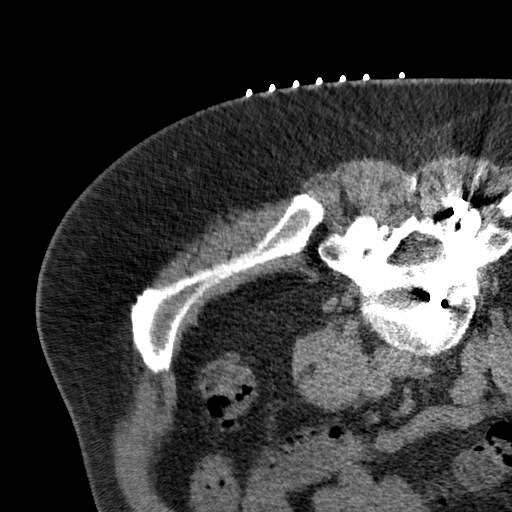
[im 81/96  lung]
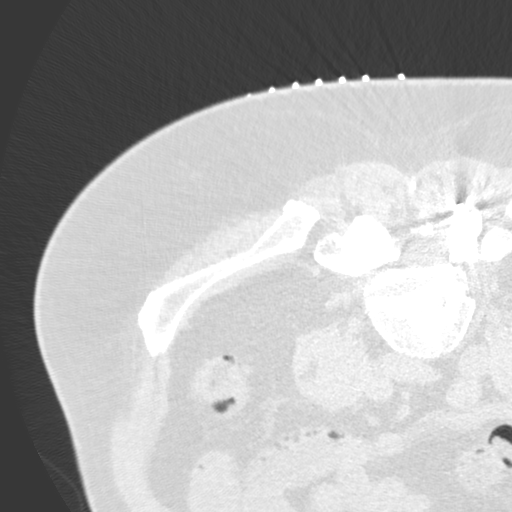
[im 85/96  soft-tissue]
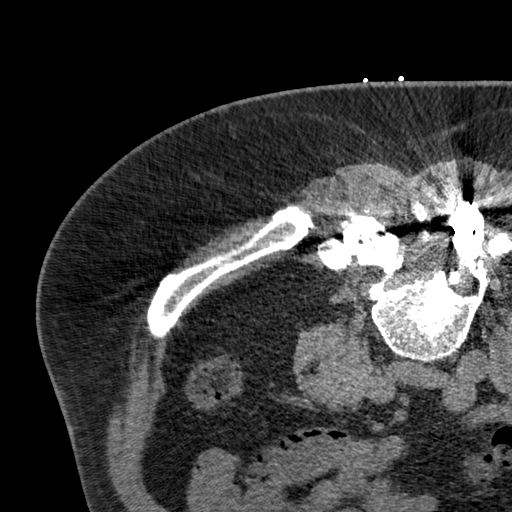
[im 85/96  lung]
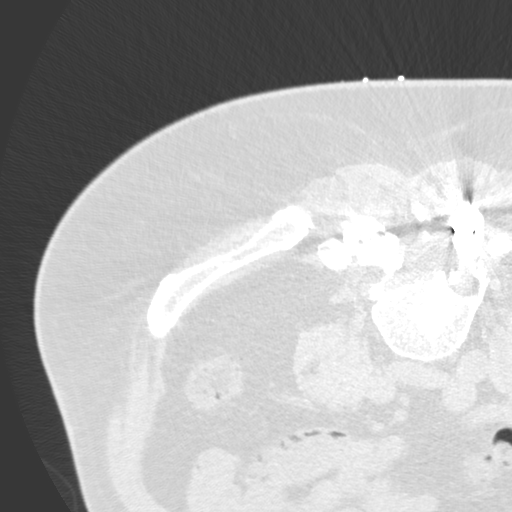
[im 88/96  lung]
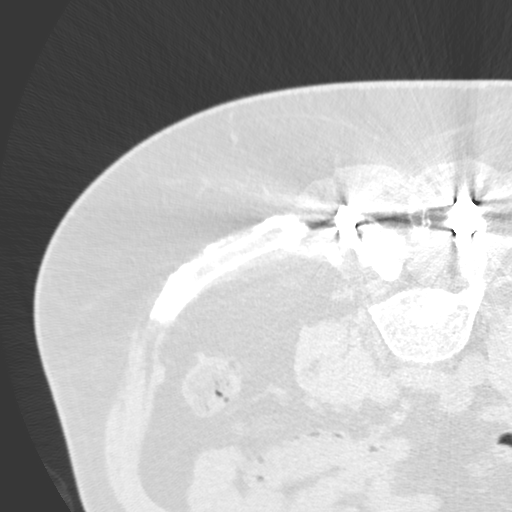
[im 92/96  soft-tissue]
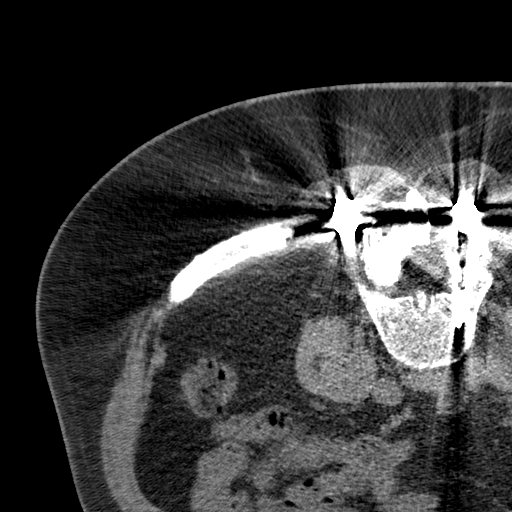
[im 92/96  lung]
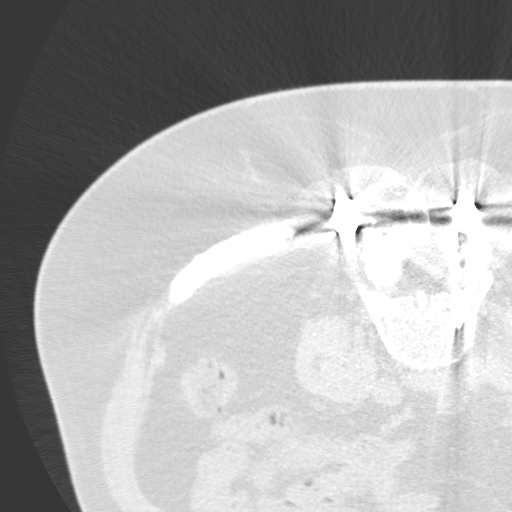

[15 of 32 positions shown; findings below may reference images not displayed]

IMPRESSION: Technically successful CT-guided left SI joint injection. 
RADIATION DOSE REDUCTION: All CT scans are performed using radiation dose 
reduction techniques, when applicable.  Technical factors are evaluated and 
adjusted to ensure appropriate moderation of exposure.  Automated dose 
management technology is applied to adjust the radiation doses to minimize 
exposure while achieving diagnostic quality images.

## 2019-12-31 IMAGING — MR MRI PELVIS WITHOUT CONTRAST
4 of 6 series · 10 of 48 positions shown · IV contrast (gadolinium)
Comparison: CT exam of 01/11/2017

MRI PELVIS WITHOUT CONTRAST, 12/31/2019 [DATE]: 
CLINICAL INDICATION: Low back pain worse on the left side which radiates to both 
hips. Patient states it is difficult to get up from laying and sitting 
positions. CML.
TECHNIQUE: Multiplanar, multiecho position MR images of the pelvis were 
performed without intravenous gadolinium enhancement.

[Series 101: survey · axial · 15.0mm · 1.76mm/px · z∈[-50,+224]mm · 3 of 17 slices shown]
[im 1/17]
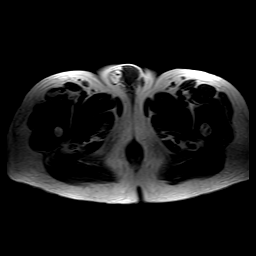
[im 9/17]
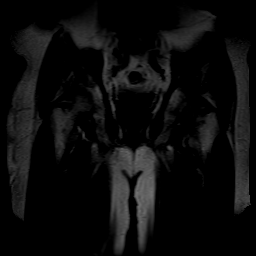
[im 17/17]
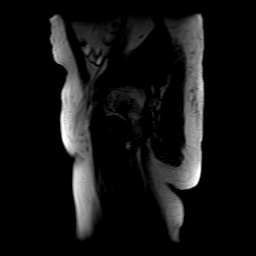

[Series 201: t1_(person_name) · axial · 6.0mm · 0.36mm/px · z∈[-39,+161]mm · 3 of 36 slices shown]
[im 6/36]
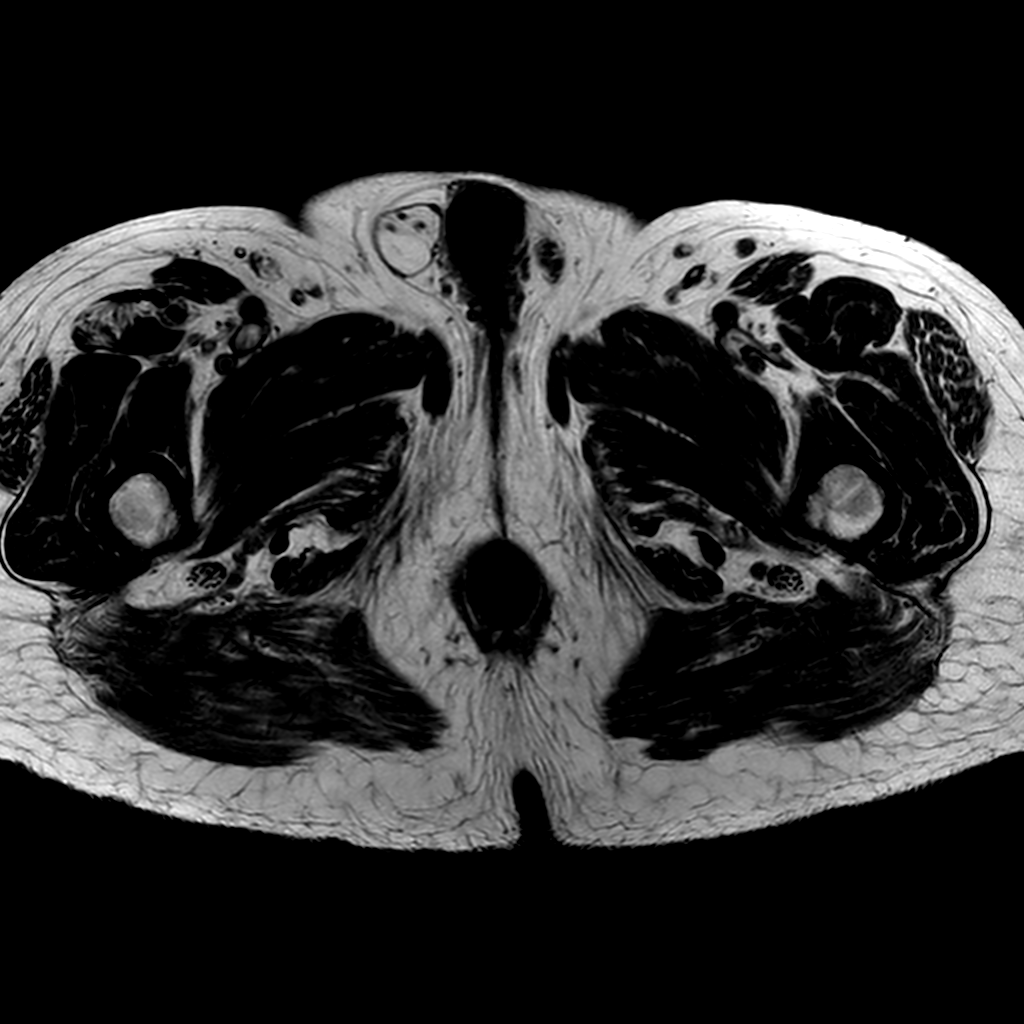
[im 21/36]
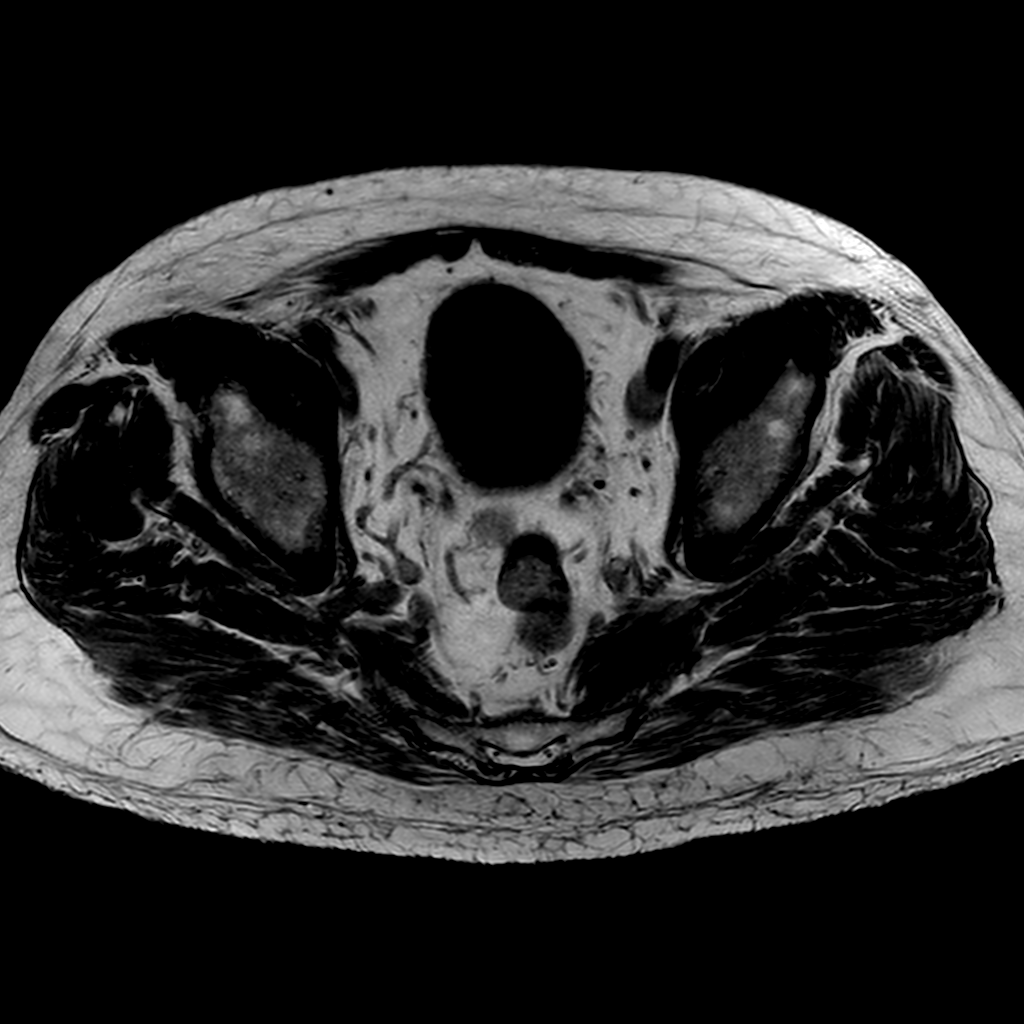
[im 31/36]
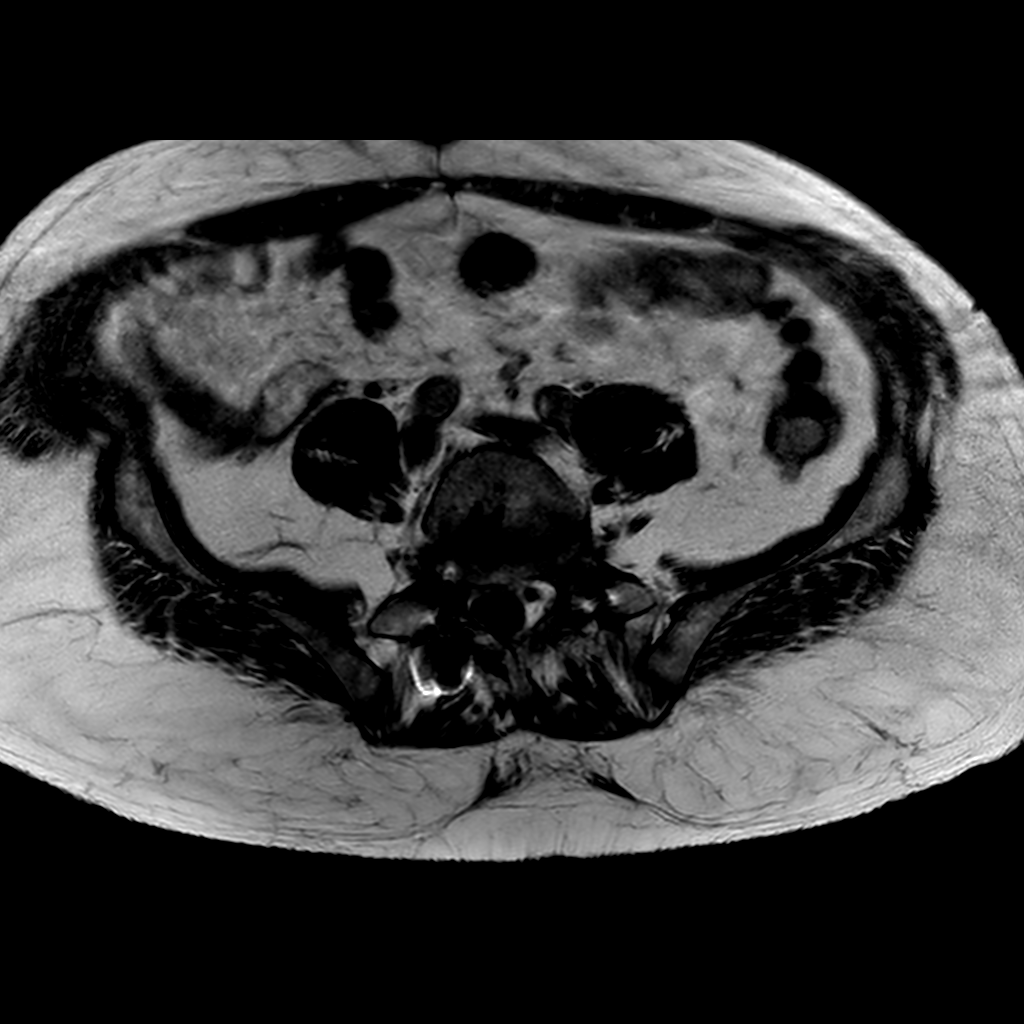

[Series 301: (person_name)_(person_name)_(person_name) · axial · 6.0mm · 0.53mm/px · z∈[-39,+161]mm · 3 of 36 slices shown]
[im 6/36]
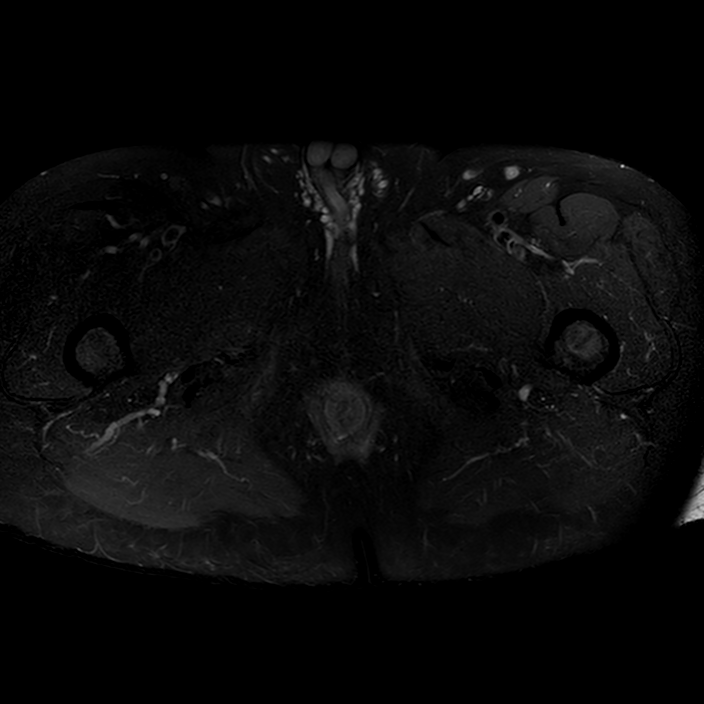
[im 21/36]
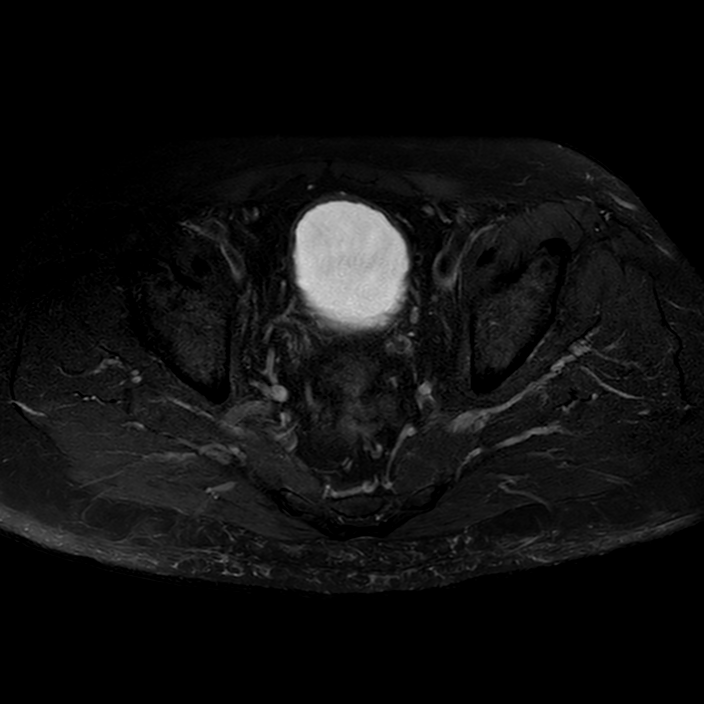
[im 31/36]
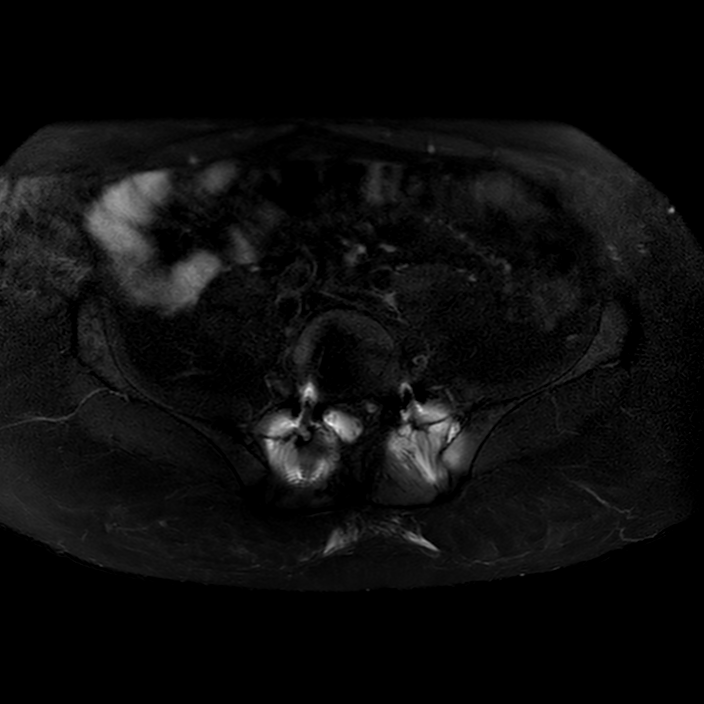

[Series 401: t1_cor · coronal · 5.0mm · 0.64mm/px · 1 of 32 slices shown]
[im 6/32]
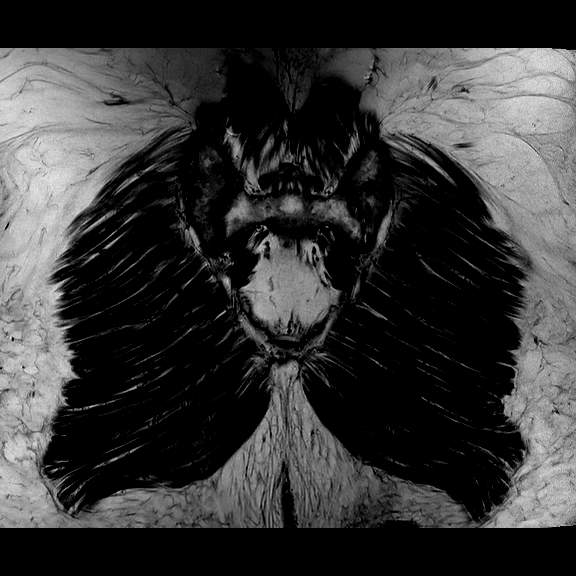

[10 of 48 positions shown; findings below may reference images not displayed]

FINDINGS: Bones and joints: No focal marrow replacing lesion or suggestion of osseous 
metastatic disease. There is metallic susceptibility artifact at site of 
hardware in place within the included lower lumbosacral spine. Degenerative 
changes included lower lumbar spine and SI joints. Articular cartilage of the 
hips is preserved. No avascular necrosis. No marrow edema or stress response. No 
discrete labral tear or prior labral cyst. No hip joint effusion. 
SOFT TISSUES: The bilateral abductor cuffs are preserved. The insertions of the 
iliopsoas tendons are intact. The origins of the hamstrings are preserved. 
Rectus femoris-adductor complex is preserved. The musculature is symmetric 
without strain, atrophy or mass. No focal fluid collection or distended bursa. 
Specifically, no iliopsoas or trochanteric bursitis. The prostate gland measures 
6 x 6.3 cm with indentation of the bladder neck. Small fat-containing right 
inguinal hernia. Intrapelvic bowel loops are negative. No pelvic 
lymphadenopathy.
IMPRESSION: 1.  No acute musculoskeletal abnormality. 
2.  Postsurgical and degenerative changes lower lumbar spine and degenerative 
changes of the SI joints. 
3.  Prostate hypertrophy.

## 2019-12-31 IMAGING — MR MRI LUMBAR SPINE WITHOUT CONTRAST
5 of 7 series · 18 of 48 positions shown · IV contrast (gadolinium)
Comparison: None

MRI LUMBAR SPINE WITHOUT CONTRAST, 12/31/2019 [DATE]: 
CLINICAL INDICATION: Stenosis, disc herniation, worsening low back pain
TECHNIQUE: Sagittal T1, Sagittal T2, Sagittal STIR, Axial T1 and Axial T2 MR 
images of the lumbar spine were performed without intravenous gadolinium 
enhancement.

[Series 101: survey · axial · 10.0mm · 1.39mm/px · z∈[-33,+201]mm · 3 of 10 slices shown]
[im 1/10]
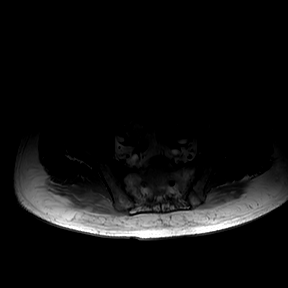
[im 5/10]
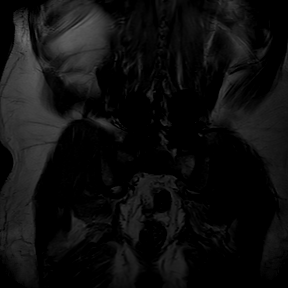
[im 10/10]
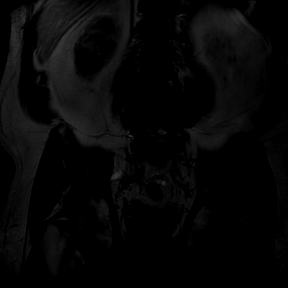

[Series 201: t2w_cor-surv · coronal · 6.0mm · 0.61mm/px · 2 of 7 slices shown]
[im 1/7]
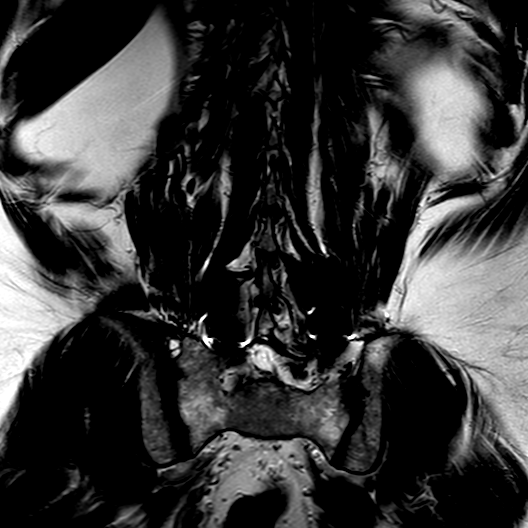
[im 7/7]
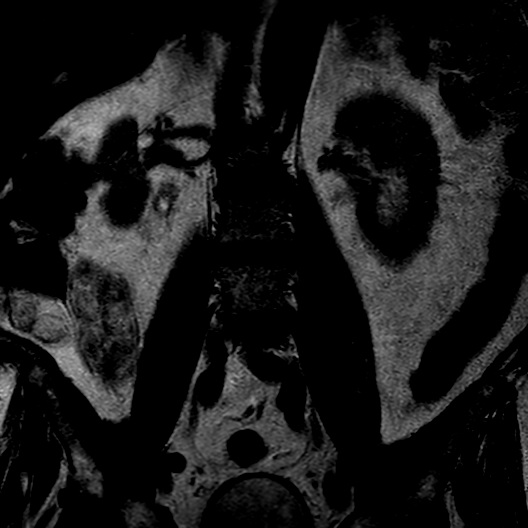

[Series 301: t1_(person_name)_(person_name) · sagittal · 4.0mm · 0.44mm/px · 5 of 17 slices shown]
[im 1/17]
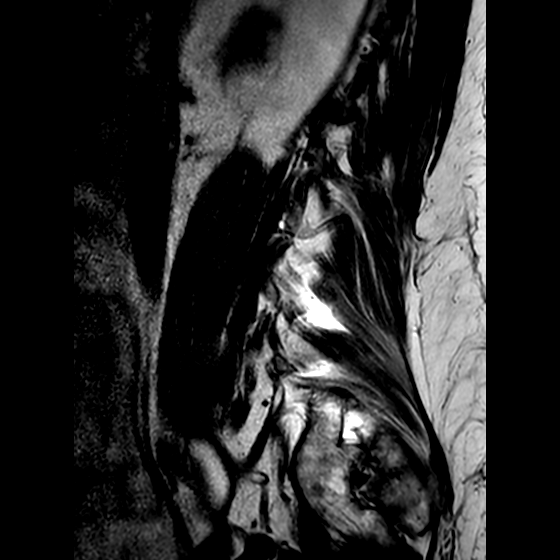
[im 5/17]
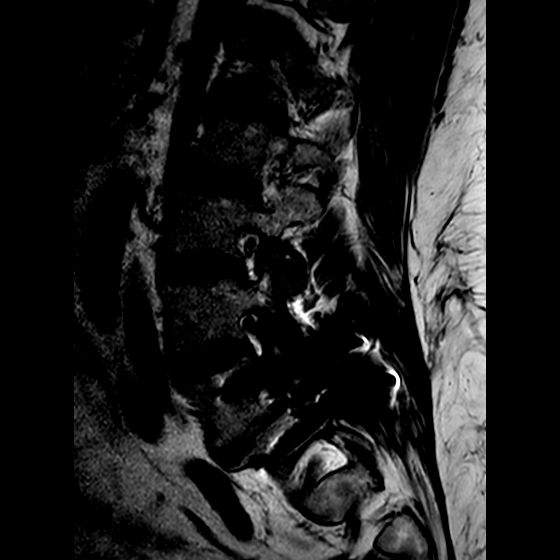
[im 9/17]
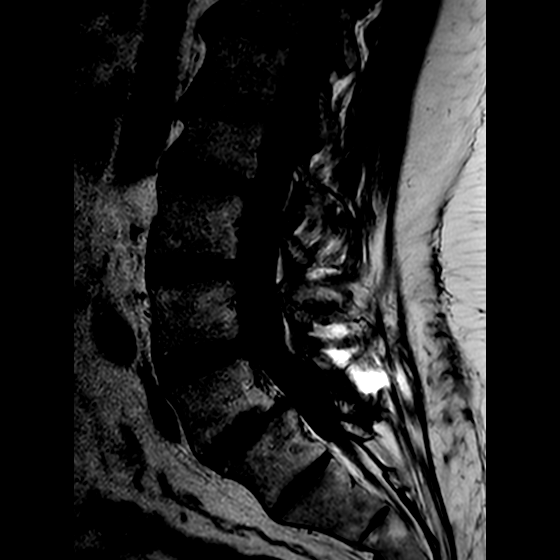
[im 13/17]
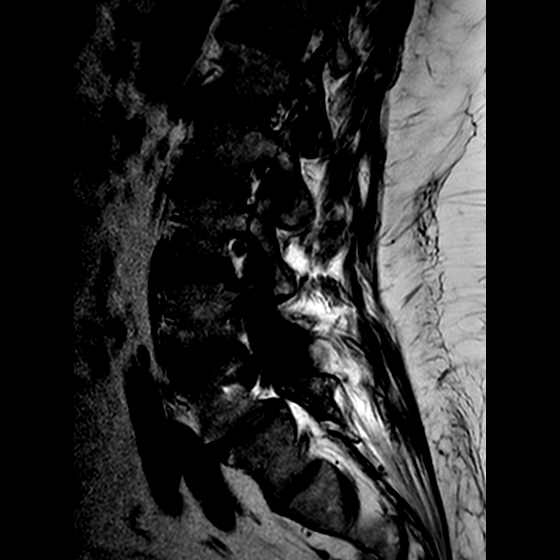
[im 17/17]
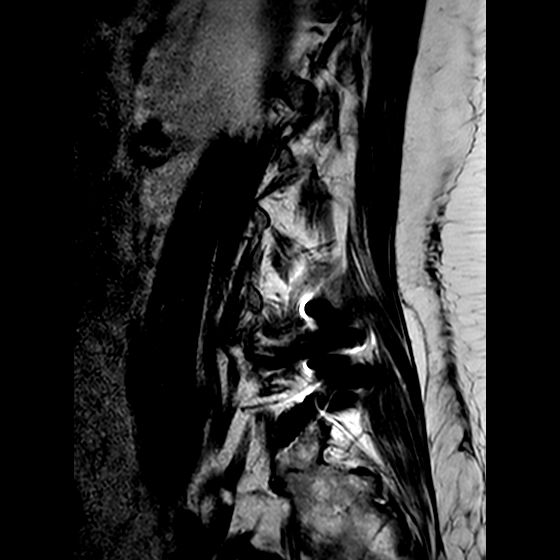

[Series 401: t2_(person_name)_(person_name) · sagittal · 4.0mm · 0.36mm/px · 5 of 17 slices shown]
[im 1/17]
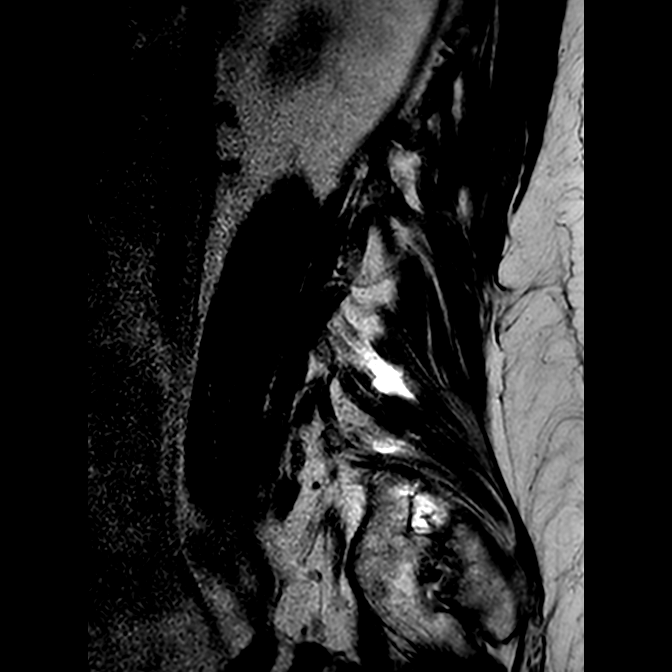
[im 5/17]
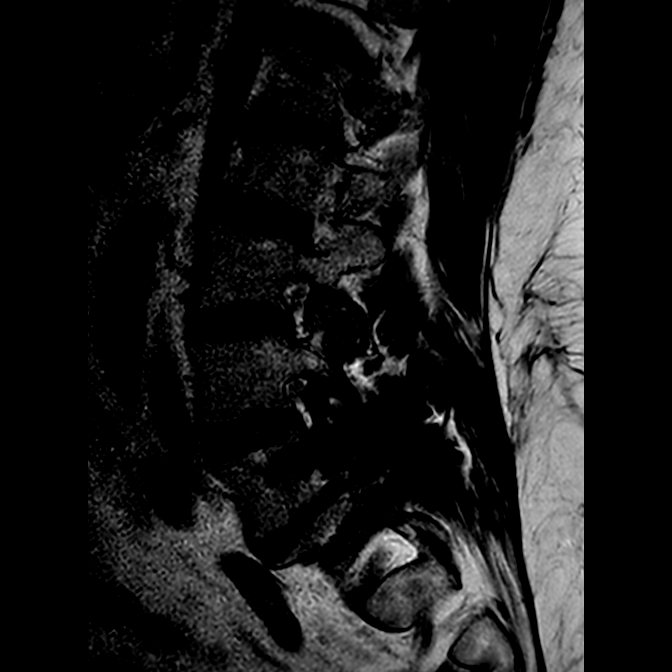
[im 9/17]
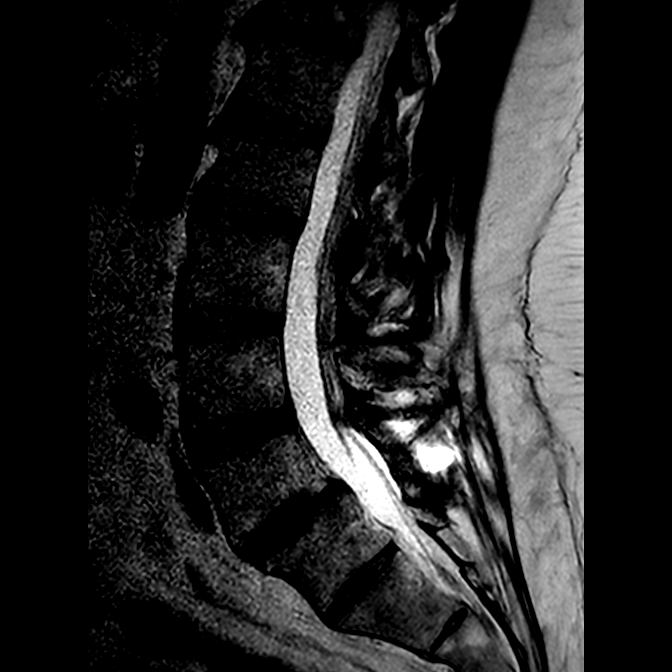
[im 13/17]
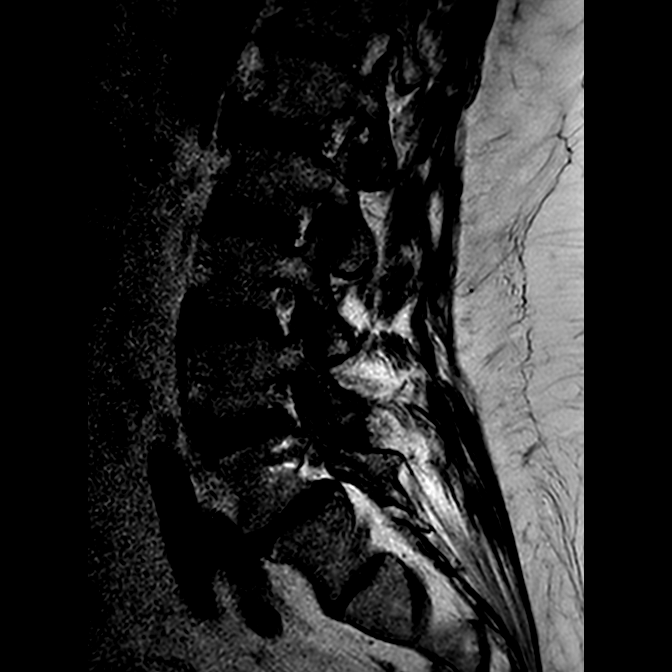
[im 17/17]
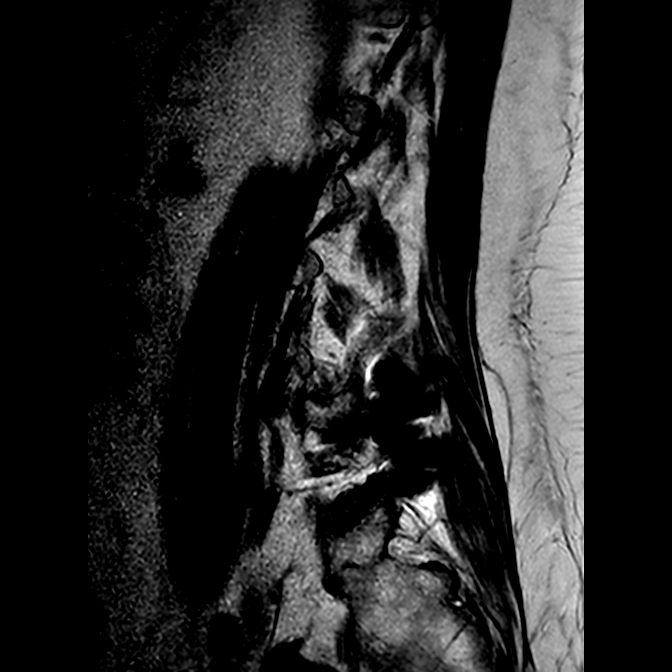

[Series 501: stir_(person_name) · sagittal · 4.0mm · 0.56mm/px · 3 of 17 slices shown]
[im 1/17]
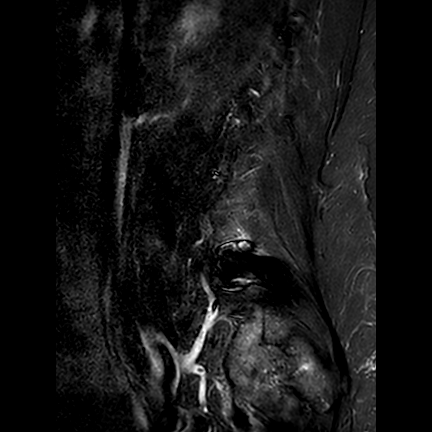
[im 5/17]
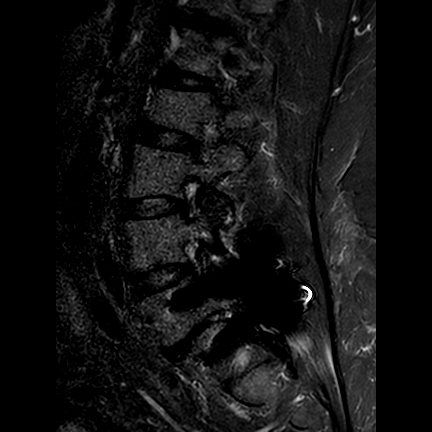
[im 9/17]
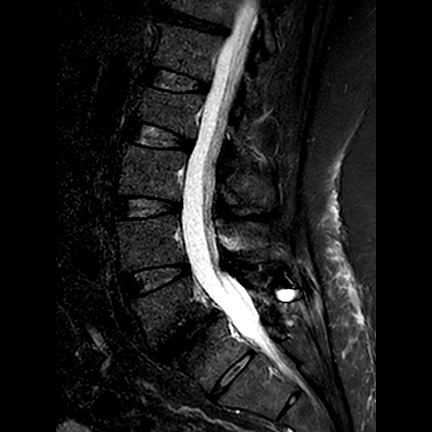

[18 of 48 positions shown; findings below may reference images not displayed]

FINDINGS: There is a rudimentary L5-S1 disc space. L5 is transitional. Bilateral 
pedicle screws at L4-5. There is less than grade 1 anterolisthesis of L4 on L5. 
There is a mild chronic wedge compression fracture of L1. Other lumbar vertebral 
heights are intact. There is no evidence for fracture or spinal malignancy. The 
conus is normal.  Visualized sacrum is intact. 
At L5-S1 the canal is open. 
At L4-5 there has been right laminectomy with ligament resection. The canal is 
open. There is mild to moderate right, mild left foraminal stenosis without 
significant nerve deformity. 
At L3-4 the disc margin is intact and the canal and foramina are open. There are 
moderate facet degenerative changes. 
At L2-3 and L1-2 the canal and foramina are open. There are mild-to-moderate 
facet changes.
IMPRESSION: Caution as to labeling: There is a rudimentary L5-S1 disc space. L5 is 
transitional. 
Status post L4-5 interbody disc cage and pedicle screw fusion. Allowing for 
susceptibility artifact hardware appears appropriately positioned. There is no 
significant canal stenosis. There is foraminal narrowing at L4-5 which appears 
worse on the right. 
Mild chronic L1 wedge compression deformity. No evidence for recent fracture.

## 2022-07-29 IMAGING — MR MULTI PARAMETRIC MRI PROSTATE W/O AND W
14 series · 48 of 48 positions shown · IV contrast (gadavist)
Comparison: None

________________________________________________________________________________________________ 
MULTI PARAMETRIC MRI PROSTATE W/O AND W, 07/29/2022 [DATE]: 
CLINICAL INDICATION: Elevated PSA. Patient with embolization in 7371.
TECHNIQUE: Multiple parametric sequences were performed Pre-contrast: T1 axial 
of the entire pelvis. T2 sagittal, axial  and coronal,T1 axial, acquired of the 
prostate. Diffusion with multiple B values of 2444, 3955 calculated ADC value 
for mapping. Post contrast: Rapid sequence dynamic and axial planes through the 
prostate and seminal vesicles,T1 axial with fat sat of the entire pelvis. 3-D 
renderings were reconstructed on an independent workstation. The images were 
also evaluated with Dyna CAD computer aided detection. 12 mL of Gadavist were 
injected intravenously. 3 mL of Gadavist  discarded. As per [HOSPITAL] guidelines 3D reconstructions are performed with concurrent physician 
supervision. Patient was scanned on a 3T magnet.

[Series 101: survey-mst · axial · 10.0mm · 1.34mm/px · 1 of 14 slices shown]
[im 1/14]
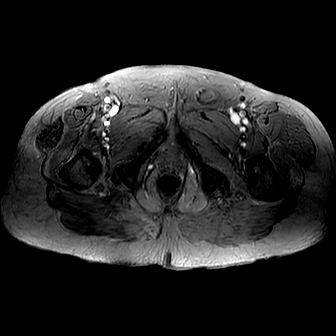

[Series 201: t1w_tse_ax · axial · 6.0mm · 0.37mm/px · 1 of 36 slices shown]
[im 1/36]
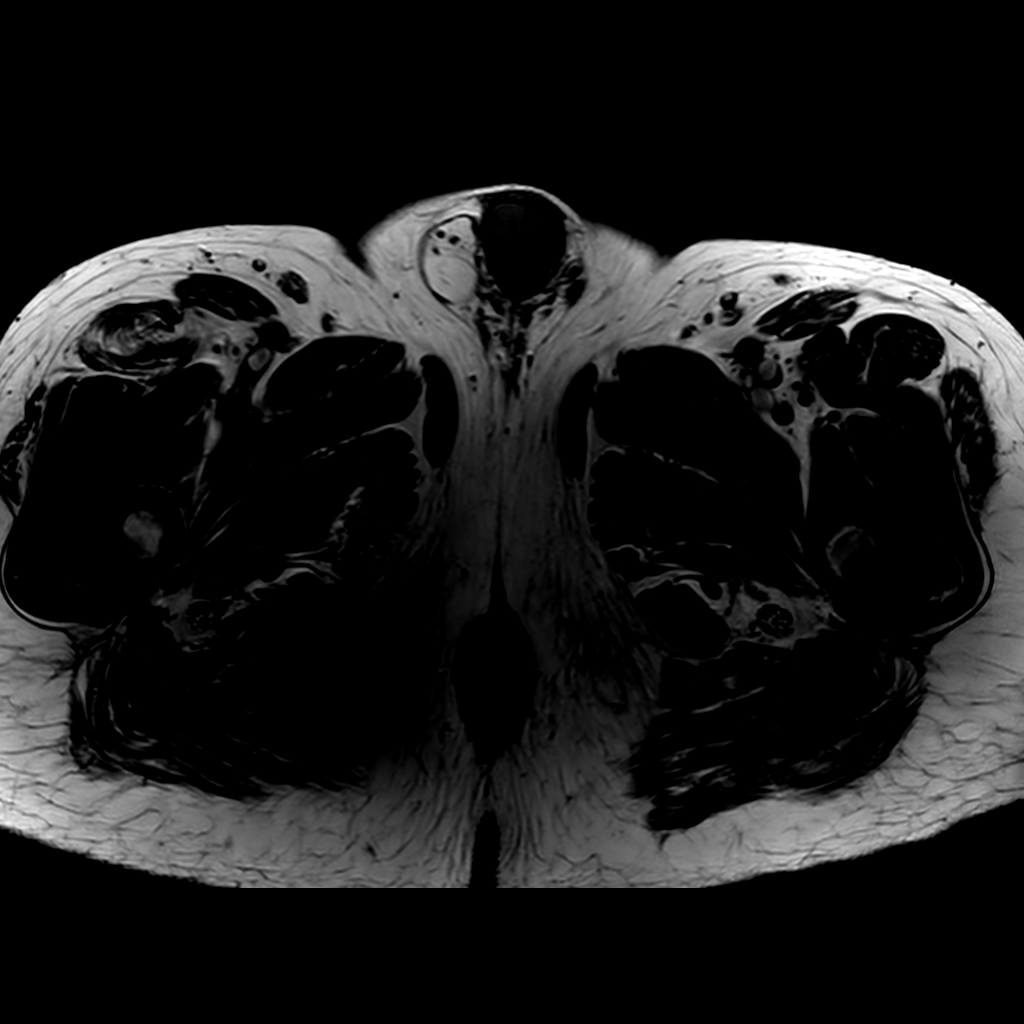

[Series 301: t2w sag · sagittal · 3.0mm · 0.42mm/px · 1 of 30 slices shown]
[im 1/30]
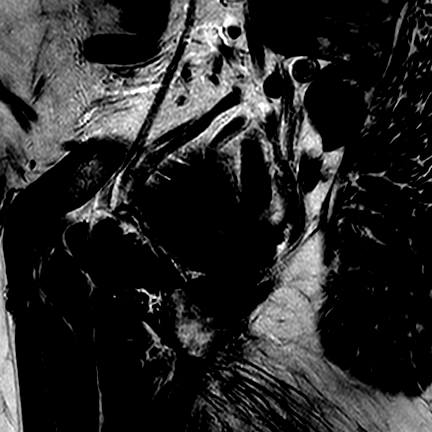

[Series 401: t2w cor · coronal · 3.0mm · 0.42mm/px · 1 of 32 slices shown]
[im 1/32]
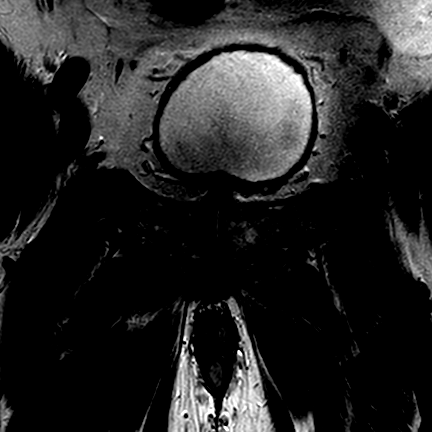

[Series 501: t2w ax · axial · 3.0mm · 0.38mm/px · 1 of 34 slices shown]
[im 1/34]
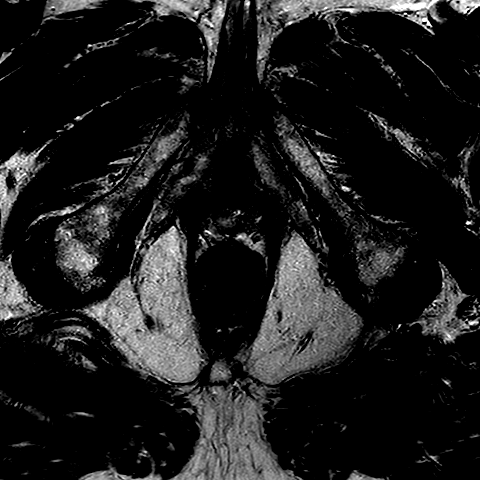

[Series 601: new-dwi_3b* 3mm* · axial · 3.0mm · 1.28mm/px · 1 of 68 slices shown]
[im 1/68]
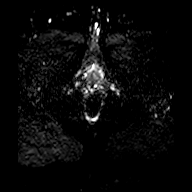

[Series 602: ADC · axial · 3.0mm · 1.28mm/px · 1 of 34 slices shown (1 of 2)]
[im 1/34]
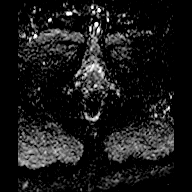

[Series 603: ADC · axial · 3.0mm · 1.28mm/px · 1 of 32 slices shown (2 of 2)]
[im 1/32]
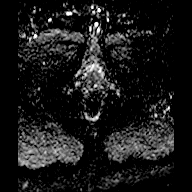

[Series 604: (id) · axial · 3.0mm · 1.28mm/px · 1 of 34 slices shown (1 of 3)]
[im 1/34]
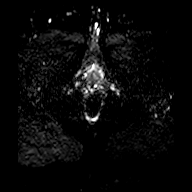

[Series 605: (id) · axial · 3.0mm · 1.28mm/px · 1 of 34 slices shown (2 of 3)]
[im 1/34]
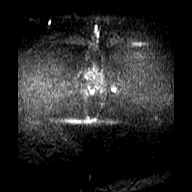

[Series 701: (id) · axial · 3.0mm · 1.28mm/px · 1 of 34 slices shown (3 of 3)]
[im 1/34]
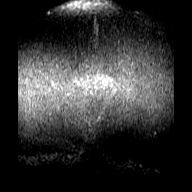

[Series 901: dyn 3mm*(ap) · axial · 3.0mm · 1.38mm/px · z∈[-24,+75]mm · 35 of 1530 slices shown]
[im 1/1530]
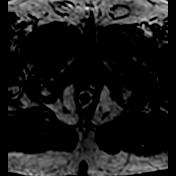
[im 45/1530]
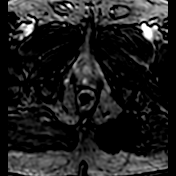
[im 90/1530]
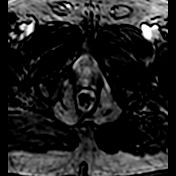
[im 135/1530]
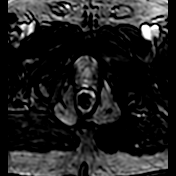
[im 180/1530]
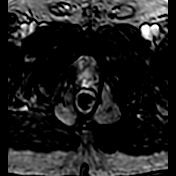
[im 225/1530]
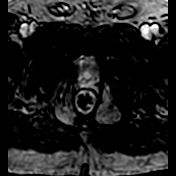
[im 270/1530]
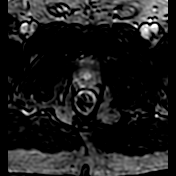
[im 315/1530]
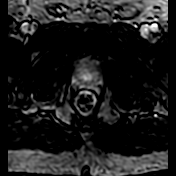
[im 360/1530]
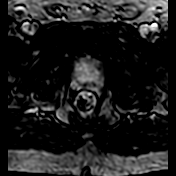
[im 405/1530]
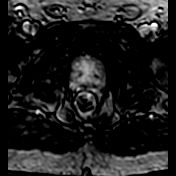
[im 450/1530]
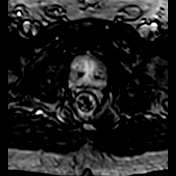
[im 495/1530]
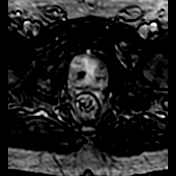
[im 540/1530]
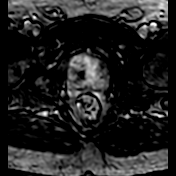
[im 585/1530]
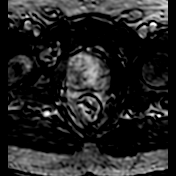
[im 630/1530]
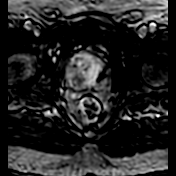
[im 675/1530]
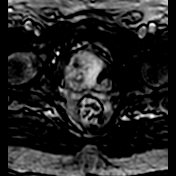
[im 720/1530]
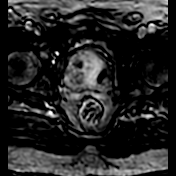
[im 765/1530]
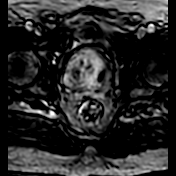
[im 810/1530]
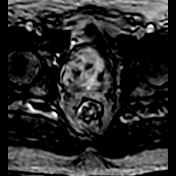
[im 855/1530]
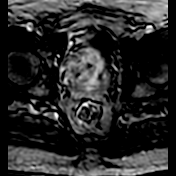
[im 900/1530]
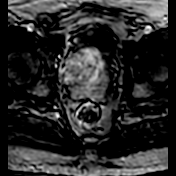
[im 945/1530]
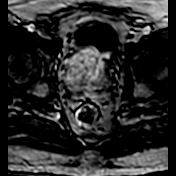
[im 990/1530]
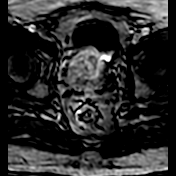
[im 1035/1530]
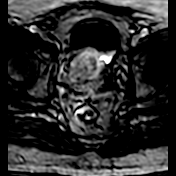
[im 1080/1530]
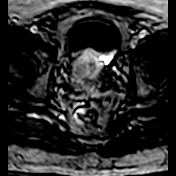
[im 1125/1530]
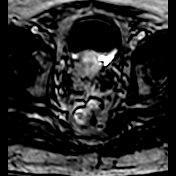
[im 1170/1530]
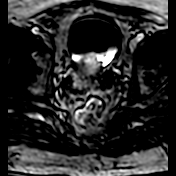
[im 1215/1530]
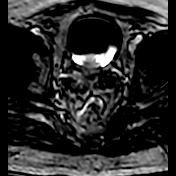
[im 1260/1530]
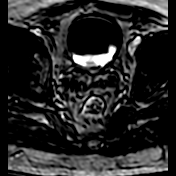
[im 1305/1530]
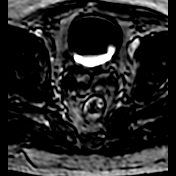
[im 1350/1530]
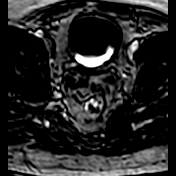
[im 1395/1530]
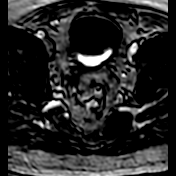
[im 1440/1530]
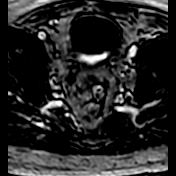
[im 1485/1530]
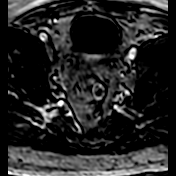
[im 1530/1530]
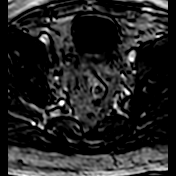

[Series 1001: DIXON · axial · 6.0mm · 0.49mm/px · 1 of 72 slices shown (1 of 2)]
[im 1/72]
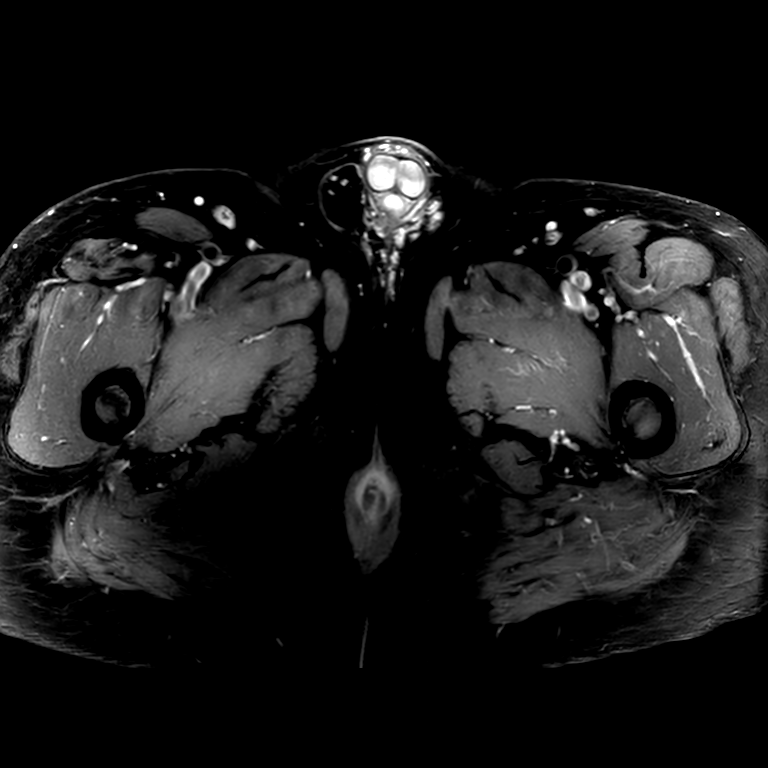

[Series 1002: DIXON · axial · 6.0mm · 0.49mm/px · 1 of 36 slices shown (2 of 2)]
[im 1/36]
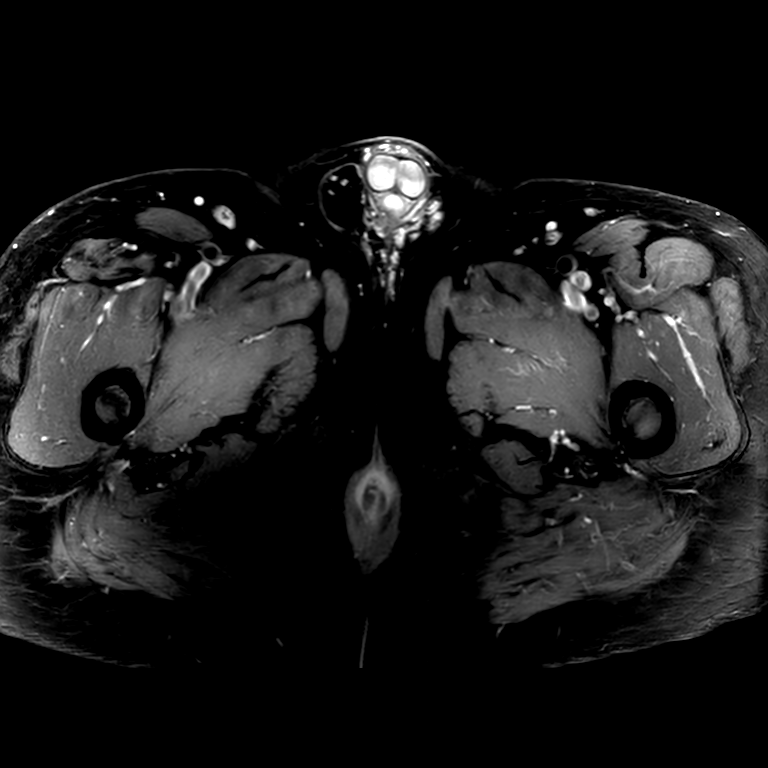

[48 of 48 positions shown; findings below may reference images not displayed]

FINDINGS: VOLUME: 157 cc 
CENTRAL GLAND: Significantly enlarged. Nodular in appearance. Elevates the 
bladder base. There are some areas of complete lack of vascularity within the 
central gland bilaterally consistent with the history of previous embolization. 
No findings suspicious for malignancy. 
PERIPHERAL ZONE: No areas of restricted diffusion to suggest malignancy. 
PROSTATE CAPSULE: Intact. 
MUSCLE SIDE WALLS: Normal in appearance. 
SEMINAL VESICLES: Normal in appearance. 
BLADDER: Mildly trabeculated. 
LYMPHADENOPATHY: None 
BONES: No suspicious osseous lesion identified. 
ADDITIONAL FINDINGS: Mild degenerative changes lower lumbar spine as well as 
postoperative changes lower lumbar spine.
IMPRESSION: Enlarged prostate gland with findings consistent with previous embolization. No 
suspicious mass identified. 
(PI-RADS 2): Low (clinically significant cancer is unlikely to be present).
